# Patient Record
Sex: Female | Born: 1980 | Race: Black or African American | Hispanic: No | Marital: Single | Smoking: Never smoker
Health system: Southern US, Community
[De-identification: ages and names within clinical notes are randomized; demographics above are authoritative.]

## PROBLEM LIST (undated history)

## (undated) DIAGNOSIS — Z789 Other specified health status: Secondary | ICD-10-CM

---

## 2007-01-01 ENCOUNTER — Emergency Department: Payer: Self-pay | Admitting: Emergency Medicine

## 2007-01-07 ENCOUNTER — Emergency Department: Payer: Self-pay | Admitting: Unknown Physician Specialty

## 2011-05-24 ENCOUNTER — Emergency Department (HOSPITAL_COMMUNITY)
Admission: EM | Admit: 2011-05-24 | Discharge: 2011-05-24 | Disposition: A | Discharge status: Home / Self Care | Payer: Self-pay | Attending: Emergency Medicine | Admitting: Emergency Medicine

## 2011-05-24 DIAGNOSIS — N390 Urinary tract infection, site not specified: Secondary | ICD-10-CM | POA: Insufficient documentation

## 2011-05-24 DIAGNOSIS — N946 Dysmenorrhea, unspecified: Secondary | ICD-10-CM | POA: Insufficient documentation

## 2011-05-24 DIAGNOSIS — R109 Unspecified abdominal pain: Secondary | ICD-10-CM | POA: Insufficient documentation

## 2011-05-24 LAB — POCT PREGNANCY, URINE: Preg Test, Ur: NEGATIVE

## 2011-05-24 LAB — URINE MICROSCOPIC-ADD ON

## 2011-05-24 LAB — URINALYSIS, ROUTINE W REFLEX MICROSCOPIC
Hgb urine dipstick: NEGATIVE
Nitrite: NEGATIVE
Specific Gravity, Urine: 1.026 (ref 1.005–1.030)
Urobilinogen, UA: 2 mg/dL — ABNORMAL HIGH (ref 0.0–1.0)

## 2019-10-16 ENCOUNTER — Encounter: Payer: Self-pay | Admitting: Emergency Medicine

## 2019-10-16 ENCOUNTER — Other Ambulatory Visit: Payer: Self-pay

## 2019-10-16 ENCOUNTER — Emergency Department: Payer: BC Managed Care – PPO

## 2019-10-16 ENCOUNTER — Inpatient Hospital Stay: Payer: BC Managed Care – PPO

## 2019-10-16 ENCOUNTER — Inpatient Hospital Stay
Admission: EM | Admit: 2019-10-16 | Discharge: 2019-10-19 | DRG: 390 | Disposition: A | Discharge status: Home / Self Care | Payer: BC Managed Care – PPO | Attending: Surgery | Admitting: Surgery

## 2019-10-16 DIAGNOSIS — Z8249 Family history of ischemic heart disease and other diseases of the circulatory system: Secondary | ICD-10-CM | POA: Diagnosis not present

## 2019-10-16 DIAGNOSIS — K56609 Unspecified intestinal obstruction, unspecified as to partial versus complete obstruction: Secondary | ICD-10-CM | POA: Diagnosis not present

## 2019-10-16 DIAGNOSIS — N83202 Unspecified ovarian cyst, left side: Secondary | ICD-10-CM | POA: Diagnosis present

## 2019-10-16 DIAGNOSIS — R9389 Abnormal findings on diagnostic imaging of other specified body structures: Secondary | ICD-10-CM

## 2019-10-16 DIAGNOSIS — Z833 Family history of diabetes mellitus: Secondary | ICD-10-CM | POA: Diagnosis not present

## 2019-10-16 DIAGNOSIS — Z20822 Contact with and (suspected) exposure to covid-19: Secondary | ICD-10-CM | POA: Diagnosis present

## 2019-10-16 DIAGNOSIS — Z0189 Encounter for other specified special examinations: Secondary | ICD-10-CM

## 2019-10-16 DIAGNOSIS — N7091 Salpingitis, unspecified: Secondary | ICD-10-CM | POA: Diagnosis present

## 2019-10-16 DIAGNOSIS — R102 Pelvic and perineal pain: Secondary | ICD-10-CM

## 2019-10-16 DIAGNOSIS — N739 Female pelvic inflammatory disease, unspecified: Secondary | ICD-10-CM | POA: Diagnosis present

## 2019-10-16 HISTORY — DX: Other specified health status: Z78.9

## 2019-10-16 LAB — URINALYSIS, COMPLETE (UACMP) WITH MICROSCOPIC
Bilirubin Urine: NEGATIVE
Glucose, UA: NEGATIVE mg/dL
Ketones, ur: 5 mg/dL — AB
Nitrite: NEGATIVE
Protein, ur: 100 mg/dL — AB
RBC / HPF: >50 RBC/hpf — ABNORMAL HIGH (ref 0–5)
Specific Gravity, Urine: 1.035 — ABNORMAL HIGH (ref 1.005–1.030)
WBC, UA: >50 WBC/hpf — ABNORMAL HIGH (ref 0–5)
pH: 5 (ref 5.0–8.0)

## 2019-10-16 LAB — COMPREHENSIVE METABOLIC PANEL
ALT: 18 U/L (ref 0–44)
AST: 24 U/L (ref 15–41)
Albumin: 4 g/dL (ref 3.5–5.0)
Alkaline Phosphatase: 77 U/L (ref 38–126)
Anion gap: 14 (ref 5–15)
BUN: 11 mg/dL (ref 6–20)
CO2: 22 mmol/L (ref 22–32)
Calcium: 9.5 mg/dL (ref 8.9–10.3)
Chloride: 96 mmol/L — ABNORMAL LOW (ref 98–111)
Creatinine, Ser: 1.17 mg/dL — ABNORMAL HIGH (ref 0.44–1.00)
GFR calc Af Amer: >60 mL/min (ref >60)
GFR calc non Af Amer: 59 mL/min — ABNORMAL LOW (ref >60)
Glucose, Bld: 174 mg/dL — ABNORMAL HIGH (ref 70–99)
Potassium: 4.3 mmol/L (ref 3.5–5.1)
Sodium: 132 mmol/L — ABNORMAL LOW (ref 135–145)
Total Bilirubin: 1.4 mg/dL — ABNORMAL HIGH (ref 0.3–1.2)
Total Protein: 9.3 g/dL — ABNORMAL HIGH (ref 6.5–8.1)

## 2019-10-16 LAB — CBC
HCT: 45.4 % (ref 36.0–46.0)
Hemoglobin: 15 g/dL (ref 12.0–15.0)
MCH: 28.7 pg (ref 26.0–34.0)
MCHC: 33 g/dL (ref 30.0–36.0)
MCV: 86.8 fL (ref 80.0–100.0)
Platelets: 340 10*3/uL (ref 150–400)
RBC: 5.23 MIL/uL — ABNORMAL HIGH (ref 3.87–5.11)
RDW: 13.7 % (ref 11.5–15.5)
WBC: 14.5 10*3/uL — ABNORMAL HIGH (ref 4.0–10.5)
nRBC: 0 % (ref 0.0–0.2)

## 2019-10-16 LAB — RESPIRATORY PANEL BY RT PCR (FLU A&B, COVID)
Influenza A by PCR: NEGATIVE
Influenza B by PCR: NEGATIVE
SARS Coronavirus 2 by RT PCR: NEGATIVE

## 2019-10-16 LAB — LIPASE, BLOOD: Lipase: 16 U/L (ref 11–51)

## 2019-10-16 MED ORDER — MORPHINE SULFATE (PF) 4 MG/ML IV SOLN: 4.0000 mg | INTRAVENOUS | Status: DC | PRN

## 2019-10-16 MED ORDER — PANTOPRAZOLE SODIUM 40 MG IV SOLR
40.0000 mg | Freq: Every day | INTRAVENOUS | Status: DC
  Administered 2019-10-17 – 2019-10-18 (×3): 40 mg via INTRAVENOUS
  Filled 2019-10-16 (×3): qty 40

## 2019-10-16 MED ORDER — ONDANSETRON 4 MG PO TBDP: 4.0000 mg | ORAL_TABLET | Freq: Four times a day (QID) | ORAL | Status: DC | PRN

## 2019-10-16 MED ORDER — ONDANSETRON HCL 4 MG/2ML IJ SOLN: 4.0000 mg | Freq: Four times a day (QID) | INTRAMUSCULAR | Status: DC | PRN

## 2019-10-16 MED ORDER — ONDANSETRON HCL 4 MG/2ML IJ SOLN
4.0000 mg | Freq: Once | INTRAMUSCULAR | Status: AC
  Administered 2019-10-16: 4 mg via INTRAVENOUS
  Filled 2019-10-16: qty 2

## 2019-10-16 MED ORDER — DIATRIZOATE MEGLUMINE & SODIUM 66-10 % PO SOLN
90.0000 mL | Freq: Once | ORAL | Status: AC
  Administered 2019-10-17: 02:00:00 90 mL via NASOGASTRIC

## 2019-10-16 MED ORDER — ENOXAPARIN SODIUM 40 MG/0.4ML ~~LOC~~ SOLN
40.0000 mg | Freq: Two times a day (BID) | SUBCUTANEOUS | Status: DC
  Administered 2019-10-17 – 2019-10-19 (×5): 40 mg via SUBCUTANEOUS
  Filled 2019-10-16 (×5): qty 0.4

## 2019-10-16 MED ORDER — LACTATED RINGERS IV SOLN: INTRAVENOUS | Status: DC

## 2019-10-16 MED ORDER — PROMETHAZINE HCL 25 MG/ML IJ SOLN: 6.2500 mg | Freq: Four times a day (QID) | INTRAMUSCULAR | Status: DC | PRN

## 2019-10-16 MED ORDER — TRAMADOL HCL 50 MG PO TABS: 50.0000 mg | ORAL_TABLET | Freq: Four times a day (QID) | ORAL | Status: DC | PRN

## 2019-10-16 MED ORDER — ALBUTEROL SULFATE (2.5 MG/3ML) 0.083% IN NEBU: 2.5000 mg | INHALATION_SOLUTION | RESPIRATORY_TRACT | Status: DC | PRN

## 2019-10-16 MED ORDER — MORPHINE SULFATE (PF) 2 MG/ML IV SOLN
2.0000 mg | INTRAVENOUS | Status: DC | PRN
  Administered 2019-10-17 – 2019-10-18 (×4): 2 mg via INTRAVENOUS
  Filled 2019-10-16 (×4): qty 1

## 2019-10-16 MED ORDER — SODIUM CHLORIDE 0.9 % IV SOLN
100.0000 mg | Freq: Two times a day (BID) | INTRAVENOUS | Status: DC
  Administered 2019-10-17 – 2019-10-19 (×6): 100 mg via INTRAVENOUS
  Filled 2019-10-16 (×8): qty 100

## 2019-10-16 MED ORDER — IOHEXOL 300 MG/ML  SOLN
125.0000 mL | Freq: Once | INTRAMUSCULAR | Status: AC | PRN
  Administered 2019-10-16: 20:00:00 125 mL via INTRAVENOUS
  Filled 2019-10-16: qty 125

## 2019-10-16 MED ORDER — MORPHINE SULFATE (PF) 4 MG/ML IV SOLN
4.0000 mg | Freq: Once | INTRAVENOUS | Status: AC
  Administered 2019-10-16: 20:00:00 4 mg via INTRAVENOUS
  Filled 2019-10-16: qty 1

## 2019-10-16 MED ORDER — ACETAMINOPHEN 325 MG PO TABS: 650.0000 mg | ORAL_TABLET | Freq: Four times a day (QID) | ORAL | Status: DC | PRN

## 2019-10-16 NOTE — ED Notes (Signed)
Urine Pregnancy negative

## 2019-10-16 NOTE — ED Triage Notes (Signed)
Pt presents to ED via POV with c/o abdominal pain x 2 days. Reports diarrhea, reports feels like she needs to vomit but can't. Pt repeatedly screaming out during triage.

## 2019-10-16 NOTE — H&P (Addendum)
Subjective:   CC: SBO  HPI:  Jamie Johnston is a 39 y.o. female who was consulted by Corky Downs for issue above.  Symptoms were first noted a few days ago. Pain is sudden onset, sharp, no instigating factors, all four abdominal quadrants.  Associated with N/V, exacerbated by diarrhea, non-bloody     Past Medical History: none reported  Past Surgical History: none reported  Family History: negative for IBD  Social History:  reports that she has never smoked. She has never used smokeless tobacco. She reports previous alcohol use. She reports previous drug use.  Current Medications: augmentin prescribed by telemedicine doc for current symptoms  Allergies:  Allergies as of 10/16/2019  . (No Known Allergies)    ROS:  General: Denies weight loss, weight gain, fatigue, fevers, chills, and night sweats. Eyes: Denies blurry vision, double vision, eye pain, itchy eyes, and tearing. Ears: Denies hearing loss, earache, and ringing in ears. Nose: Denies sinus pain, congestion, infections, runny nose, and nosebleeds. Mouth/throat: Denies hoarseness, sore throat, bleeding gums, and difficulty swallowing. Heart: Denies chest pain, palpitations, racing heart, irregular heartbeat, leg pain or swelling, and decreased activity tolerance. Respiratory: Denies breathing difficulty, shortness of breath, wheezing, cough, and sputum. GI: Denies change in appetite, heartburn, constipation, and blood in stool. GU: Denies difficulty urinating, pain with urinating, urgency, frequency, blood in urine. Musculoskeletal: Denies joint stiffness, pain, swelling, muscle weakness. Skin: Denies rash, itching, mass, tumors, sores, and boils Neurologic: Denies headache, fainting, dizziness, seizures, numbness, and tingling. Psychiatric: Denies depression, anxiety, difficulty sleeping, and memory loss. Endocrine: Denies heat or cold intolerance, and increased thirst or urination. Blood/lymph: Denies easy bruising, easy  bruising, and swollen glands     Objective:     BP (!) 144/96   Pulse 73   Temp 98.2 F (36.8 C) (Oral)   Resp (!) 24   Ht 5\' 6"  (1.676 m)   Wt (!) 139.7 kg   SpO2 98%   BMI 49.71 kg/m   Constitutional :  alert, cooperative, appears stated age and no distress  Lymphatics/Throat:  no asymmetry, masses, or scars  Respiratory:  clear to auscultation bilaterally  Cardiovascular:  regular rate and rhythm  Gastrointestinal: soft, no guarding, some TTP in all four quadrants.   Musculoskeletal: Steady movement  Skin: Cool and moist, no surgical scars   Psychiatric: Normal affect, non-agitated, not confused       LABS:  CMP Latest Ref Rng & Units 10/16/2019  Glucose 70 - 99 mg/dL 174(H)  BUN 6 - 20 mg/dL 11  Creatinine 0.44 - 1.00 mg/dL 1.17(H)  Sodium 135 - 145 mmol/L 132(L)  Potassium 3.5 - 5.1 mmol/L 4.3  Chloride 98 - 111 mmol/L 96(L)  CO2 22 - 32 mmol/L 22  Calcium 8.9 - 10.3 mg/dL 9.5  Total Protein 6.5 - 8.1 g/dL 9.3(H)  Total Bilirubin 0.3 - 1.2 mg/dL 1.4(H)  Alkaline Phos 38 - 126 U/L 77  AST 15 - 41 U/L 24  ALT 0 - 44 U/L 18   CBC Latest Ref Rng & Units 10/16/2019  WBC 4.0 - 10.5 K/uL 14.5(H)  Hemoglobin 12.0 - 15.0 g/dL 15.0  Hematocrit 36.0 - 46.0 % 45.4  Platelets 150 - 400 K/uL 340    RADS: CLINICAL DATA:  Acute abdominal pain.  EXAM: CT ABDOMEN AND PELVIS WITH CONTRAST  TECHNIQUE: Multidetector CT imaging of the abdomen and pelvis was performed using the standard protocol following bolus administration of intravenous contrast.  CONTRAST:  131mL OMNIPAQUE IOHEXOL 300 MG/ML  SOLN  COMPARISON:  None.  FINDINGS: Lower chest: Minimal atelectasis at the lung bases posteriorly. Otherwise normal.  Hepatobiliary: No focal liver abnormality is seen. No gallstones, gallbladder wall thickening, or biliary dilatation.  Pancreas: Unremarkable. No pancreatic ductal dilatation or surrounding inflammatory changes.  Spleen: Normal in size without  focal abnormality.  Adrenals/Urinary Tract: The adrenal glands and kidneys are normal. No hydronephrosis. The bladder appears normal. There are inflammatory changes in the peritoneal fat around the dome of the bladder.  Stomach/Bowel: The colon appears normal including the appendix. The stomach and most of the small bowel are distended and there is an abrupt transition point in the distal small bowel best seen on image 63 of series 2 and image 39 of series 5. There is no mass at that site. The small bowel distal to this site is not distended. There is a distended tubular structure measuring approximately 18 mm in diameter in the right side of the pelvis. This could represent a dilated Meckel's diverticulum or possibly a dilated fallopian tube. There is inflammation in the peritoneal fat around this tubular structure extending around the nondistended distal small bowel. The terminal ileum appears decompressed and normal. There is a tiny amount of free fluid in the right lower quadrant as well as a small amount of fluid in the pelvic cul-de-sac.  Vascular/Lymphatic: No significant vascular findings are present. No enlarged abdominal or pelvic lymph nodes.  Reproductive: The uterus appears normal. 3 cm cyst or prominent follicle on the left ovary. The right ovary is not well seen. There is a tubular structure in the region of the right adnexa which could represent a dilated fallopian tube or a Meckel's diverticulum with adjacent inflammation. No free air or discrete abscess at this time.  Other: No abdominal wall hernia.  Musculoskeletal: No acute or significant osseous findings.  IMPRESSION: 1. Small bowel obstruction with a transition point in the distal small bowel. 2. Dilated tubular structure in the right side of the pelvis which could represent a dilated fallopian tube or possibly an inflamed Meckel's diverticulum with adjacent inflammation. 3. Small amount of free  fluid in the right lower quadrant and in the pelvic cul-de-sac.  Electronically Signed: By: Francene Boyers M.D. On: 10/16/2019 20:53 Assessment:   SBO  Plan:    Unknown origin, with CT results noted above.  Will start with pelvic US to see if able to identify fallopian tube vs possible meckel's.  Even if meckel's diverticulitis, unsure why it will cause sudden stricturing as seen on CT.  Will start with NG tube decompression, SBFT to see if obstruction resolves.  IVF.  Will hold off on ABX for now, until US done to further assess.  ADDENDUM:  [place] concerning for bilateral pyosalpinx.  Dr. Valentino Saxon, OBGYN consulted and recommended to start doxy IV for now.   Will still proceed with SBFT to see if she truly has obstruction or if this is potentially reactive ileus to her PID.

## 2019-10-16 NOTE — ED Notes (Signed)
ED TO INPATIENT HANDOFF REPORT  ED Nurse Name and Phone #: Anette Riedel [phone]  S Name/Age/Gender Jamie Johnston 39 y.o. female Room/Bed: ED05A/ED05A  Code Status   Code Status: Not on file  Home/SNF/Other Home Patient oriented to: self, place, time and situation Is this baseline? Yes   Triage Complete: Triage complete  Chief Complaint SBO (small bowel obstruction) (HCC) [K56.609]  Triage Note Pt presents to ED via POV with c/o abdominal pain x 2 days. Reports diarrhea, reports feels like she needs to vomit but can't. Pt repeatedly screaming out during triage.     Allergies No Known Allergies  Level of Care/Admitting Diagnosis ED Disposition    ED Disposition Condition Comment   Admit  Hospital Area: [facility] [[id number]]  Level of Care: Med-Surg [16]  Covid Evaluation: Asymptomatic Screening Protocol (No Symptoms)  Diagnosis: SBO (small bowel obstruction) [facility]) [[id number]]  Admitting Physician: Sung Amabile [[id number]]  Attending Physician: Sung Amabile [id number]  Estimated length of stay: 3 - 4 days  Certification:: I certify this patient will need inpatient services for at least 2 midnights       B Medical/Surgery History History reviewed. No pertinent past medical history. History reviewed. No pertinent surgical history.   A IV Location/Drains/Wounds Patient Lines/Drains/Airways Status   Active Line/Drains/Airways    Name:   Placement date:   Placement time:   Site:   Days:   Peripheral IV 10/16/19 Right;Anterior Forearm   10/16/19    1934    Forearm   less than 1   NG/OG Tube Nasogastric 16 Fr. Left nare Aucultation Documented cm marking at nare/ corner of mouth 54 cm   10/16/19    2138    Left nare   less than 1          Intake/Output Last 24 hours No intake or output data in the 24 hours ending 10/16/19 2253  Labs/Imaging Results for orders placed or performed during the hospital encounter of 10/16/19 (from the past 48 hour(s))  CBC      Status: Abnormal   Collection Time: 10/16/19  7:18 PM  Result Value Ref Range   WBC 14.5 (H) 4.0 - 10.5 K/uL   RBC 5.23 (H) 3.87 - 5.11 MIL/uL   Hemoglobin 15.0 12.0 - 15.0 g/dL   HCT 85.6 31.4 - 97.0 %   MCV 86.8 80.0 - 100.0 fL   MCH 28.7 26.0 - 34.0 pg   MCHC 33.0 30.0 - 36.0 g/dL   RDW 26.3 78.5 - 88.5 %   Platelets 340 150 - 400 K/uL   nRBC 0.0 0.0 - 0.2 %    Comment: Performed at [facility], [address] [place], [place] [address]  Comprehensive metabolic panel     Status: Abnormal   Collection Time: 10/16/19  7:18 PM  Result Value Ref Range   Sodium 132 (L) 135 - 145 mmol/L   Potassium 4.3 3.5 - 5.1 mmol/L    Comment: HEMOLYSIS AT THIS LEVEL MAY AFFECT RESULT   Chloride 96 (L) 98 - 111 mmol/L   CO2 22 22 - 32 mmol/L   Glucose, Bld 174 (H) 70 - 99 mg/dL   BUN 11 6 - 20 mg/dL   Creatinine, Ser 1.28 (H) 0.44 - 1.00 mg/dL   Calcium 9.5 8.9 - 78.6 mg/dL   Total Protein 9.3 (H) 6.5 - 8.1 g/dL   Albumin 4.0 3.5 - 5.0 g/dL   AST 24 15 - 41 U/L    Comment: HEMOLYSIS  AT THIS LEVEL MAY AFFECT RESULT   ALT 18 0 - 44 U/L   Alkaline Phosphatase 77 38 - 126 U/L   Total Bilirubin 1.4 (H) 0.3 - 1.2 mg/dL    Comment: HEMOLYSIS AT THIS LEVEL MAY AFFECT RESULT   GFR calc non Af Amer 59 (L) >60 mL/min   GFR calc Af Amer >60 >60 mL/min   Anion gap 14 5 - 15    Comment: Performed at [facility], [address] [place], [place] [address]  Lipase, blood     Status: None   Collection Time: 10/16/19  7:18 PM  Result Value Ref Range   Lipase 16 11 - 51 U/L    Comment: Performed at [facility], [address] [place], [place] [address]  Urinalysis, Complete w Microscopic     Status: Abnormal   Collection Time: 10/16/19  7:18 PM  Result Value Ref Range   Color, Urine AMBER (A) YELLOW    Comment: BIOCHEMICALS MAY BE AFFECTED BY COLOR   APPearance CLOUDY (A) CLEAR   Specific Gravity, Urine 1.035 (H) 1.005 - 1.030   pH 5.0 5.0 - 8.0   Glucose, UA  NEGATIVE NEGATIVE mg/dL   Hgb urine dipstick SMALL (A) NEGATIVE   Bilirubin Urine NEGATIVE NEGATIVE   Ketones, ur 5 (A) NEGATIVE mg/dL   Protein, ur 295 (A) NEGATIVE mg/dL   Nitrite NEGATIVE NEGATIVE   Leukocytes,Ua TRACE (A) NEGATIVE   RBC / HPF >50 (H) 0 - 5 RBC/hpf   WBC, UA >50 (H) 0 - 5 WBC/hpf   Bacteria, UA FEW (A) NONE SEEN   Squamous Epithelial / LPF 6-10 0 - 5   Mucus PRESENT    Hyaline Casts, UA PRESENT    Non Squamous Epithelial PRESENT (A) NONE SEEN    Comment: Performed at [facility], [address] [place], [place] [address]   CT ABDOMEN PELVIS W CONTRAST  Addendum Date: 10/16/2019   ADDENDUM REPORT: 10/16/2019 21:02 ADDENDUM: Critical Value/emergent results were called by telephone at the time of interpretation on 10/16/2019 at 9:01 pm to the emergency room physician, who verbally acknowledged these results. Electronically Signed   By: Francene Boyers M.D.   On: 10/16/2019 21:02   Result Date: 10/16/2019 CLINICAL DATA:  Acute abdominal pain. EXAM: CT ABDOMEN AND PELVIS WITH CONTRAST TECHNIQUE: Multidetector CT imaging of the abdomen and pelvis was performed using the standard protocol following bolus administration of intravenous contrast. CONTRAST:  OMNIPAQUE IOHEXOL 300 MG/ML  SOLN COMPARISON:  None. FINDINGS: Lower chest: Minimal atelectasis at the lung bases posteriorly. Otherwise normal. Hepatobiliary: No focal liver abnormality is seen. No gallstones, gallbladder wall thickening, or biliary dilatation. Pancreas: Unremarkable. No pancreatic ductal dilatation or surrounding inflammatory changes. Spleen: Normal in size without focal abnormality. Adrenals/Urinary Tract: The adrenal glands and kidneys are normal. No hydronephrosis. The bladder appears normal. There are inflammatory changes in the peritoneal fat around the dome of the bladder. Stomach/Bowel: The colon appears normal including the appendix. The stomach and most of the small bowel are distended and  there is an abrupt transition point in the distal small bowel best seen on image 63 of series 2 and image 39 of series 5. There is no mass at that site. The small bowel distal to this site is not distended. There is a distended tubular structure measuring approximately 18 mm in diameter in the right side of the pelvis. This could represent a dilated Meckel's diverticulum or possibly a dilated fallopian tube.  There is inflammation in the peritoneal fat around this tubular structure extending around the nondistended distal small bowel. The terminal ileum appears decompressed and normal. There is a tiny amount of free fluid in the right lower quadrant as well as a small amount of fluid in the pelvic cul-de-sac. Vascular/Lymphatic: No significant vascular findings are present. No enlarged abdominal or pelvic lymph nodes. Reproductive: The uterus appears normal. 3 cm cyst or prominent follicle on the left ovary. The right ovary is not well seen. There is a tubular structure in the region of the right adnexa which could represent a dilated fallopian tube or a Meckel's diverticulum with adjacent inflammation. No free air or discrete abscess at this time. Other: No abdominal wall hernia. Musculoskeletal: No acute or significant osseous findings. IMPRESSION: 1. Small bowel obstruction with a transition point in the distal small bowel. 2. Dilated tubular structure in the right side of the pelvis which could represent a dilated fallopian tube or possibly an inflamed Meckel's diverticulum with adjacent inflammation. 3. Small amount of free fluid in the right lower quadrant and in the pelvic cul-de-sac. Electronically Signed: By: Lorriane Shire M.D. On: 10/16/2019 20:53   US PELVIC COMPLETE WITH TRANSVAGINAL  Result Date: 10/16/2019 CLINICAL DATA:  Possible dilated fallopian tube on CT EXAM: TRANSABDOMINAL AND TRANSVAGINAL ULTRASOUND OF PELVIS TECHNIQUE: Both transabdominal and transvaginal ultrasound examinations of the  pelvis were performed. Transabdominal technique was performed for global imaging of the pelvis including uterus, ovaries, adnexal regions, and pelvic cul-de-sac. It was necessary to proceed with endovaginal exam following the transabdominal exam to visualize the ovaries and adnexa. COMPARISON:  CT earlier today FINDINGS: Uterus Measurements: 6.9 x 4.4 x 5.8 cm = volume: 90 mL. Small 9 mm fibroid in the inferior left uterus. Endometrium Thickness: 7 mm in thickness.  No focal abnormality visualized. Right ovary Measurements: 2.9 x 2.6 x 2.7 cm = volume: 11 mL. Dilated tubular structure in the right adnexa with internal echoes. Left ovary Measurements: 3.8 x 2.4 x 2.7 cm = volume: 11.9 mL. 2.6 cm hypoechoic complex cystic structure, likely hemorrhagic cyst. Irregular fluid-filled tubular structure noted in the left adnexa with internal echoes. Other findings Small amount of complex free fluid in the pelvis. IMPRESSION: Irregular fluid-filled tubular structures in the adnexa bilaterally with internal echoes. Question bilateral pyosalpinx. 2.6 cm complex cyst in the left ovary, likely hemorrhagic cyst. Complex free fluid in the pelvis. Electronically Signed   By: Rolm Baptise M.D.   On: 10/16/2019 22:42    Pending Labs [employer] (From admission, onward)    Start     Ordered   Signed and Held  HIV Antibody (routine testing w rflx)  (HIV Antibody (Routine testing w reflex) panel)  Once,   R     Signed and Held   Signed and Held  Basic metabolic panel  Daily,   R     Signed and Held   Signed and Held  Magnesium  Daily,   R     Signed and Held   Signed and Held  Phosphorus  Daily,   R     Signed and Held   Signed and Held  CBC  Daily,   R     Signed and Held          Vitals/Pain Today's Vitals   10/16/19 1905 10/16/19 1930 10/16/19 2136 10/16/19 2145  BP:  (!) 144/96 (!) 145/90   Pulse: 76 73 82 81  Resp:  Temp:      TempSrc:      SpO2: 98% 98% 95% 96%  Weight:      Height:       PainSc:        Isolation Precautions No active isolations  Medications Medications  morphine 4 MG/ML injection 4 mg (has no administration in time range)  morphine 4 MG/ML injection 4 mg (4 mg Intravenous Given 10/16/19 1937)  ondansetron (ZOFRAN) injection 4 mg (4 mg Intravenous Given 10/16/19 1937)  iohexol (OMNIPAQUE) 300 MG/ML solution 125 mL (125 mLs Intravenous Contrast Given 10/16/19 2021)    Mobility walks Low fall risk   Focused Assessments    R Recommendations: See Admitting Provider Note  Report given to:   Additional Notes:

## 2019-10-16 NOTE — ED Provider Notes (Signed)
[facility] Emergency Department Provider Note   ____________________________________________    I have reviewed the triage vital signs and the nursing notes.   HISTORY  Chief Complaint Abdominal Pain     HPI KEYIRA MONDESIR is a 39 y.o. female who presents with complaints of diffuse abdominal pain.  Patient reports severe abdominal pain which has been ongoing for nearly 2 days.  She is unable to pinpoint where the pain is worst but reports it is diffuse.  It does not radiate.  She does have nausea but no vomiting.  Normal stools.  No dysuria, no hematuria.  She has never had this before.  No history of abdominal surgeries.  She does not take anything for this.  History reviewed. No pertinent past medical history.  There are no problems to display for this patient.   History reviewed. No pertinent surgical history.  Prior to Admission medications   Not on File     Allergies Patient has no known allergies.  No family history on file.  Social History Social History   Tobacco Use  . Smoking status: Never Smoker  . Smokeless tobacco: Never Used  Substance Use Topics  . Alcohol use: Not Currently  . Drug use: Not Currently    Review of Systems  Constitutional: No fever/chills Eyes: No visual changes.  ENT: No sore throat. Cardiovascular: Denies chest pain. Respiratory: Denies shortness of breath. Gastrointestinal: As above Genitourinary: Negative for dysuria.  No hematuria  musculoskeletal: Negative for back pain. Skin: Negative for rash. Neurological: Negative for headaches   ____________________________________________   PHYSICAL EXAM:  VITAL SIGNS: ED Triage Vitals [10/16/19 1854]  Enc Vitals Group     BP (!) 138/101     Pulse Rate 87     Resp (!) 24     Temp 98.2 F (36.8 C)     Temp Source Oral     SpO2 98 %     Weight (!) 139.7 kg (308 lb)     Height 1.676 m (5\' 6" )     Head Circumference      Peak Flow       Pain Score 10     Pain Loc      Pain Edu?      Excl. in GC?     Constitutional: Alert and oriented.  Anxious  Nose: No congestion/rhinnorhea. Mouth/Throat: Mucous membranes are moist.    Cardiovascular: Normal rate, regular rhythm.  Good peripheral circulation. Respiratory: Normal respiratory effort.  No retractions.. Gastrointestinal: Soft, diffuse tenderness to palpation, no CVA tenderness, no significant distention  Musculoskeletal: .  Warm and well perfused Neurologic:  Normal speech and language. No gross focal neurologic deficits are appreciated.  Skin:  Skin is warm, dry and intact. No rash noted. Psychiatric: Mood and affect are normal. Speech and behavior are normal.  ____________________________________________   LABS (all labs ordered are listed, but only abnormal results are displayed)  Labs Reviewed  CBC - Abnormal; Notable for the following components:      Result Value   WBC 14.5 (*)    RBC 5.23 (*)    All other components within normal limits  COMPREHENSIVE METABOLIC PANEL - Abnormal; Notable for the following components:   Sodium 132 (*)    Chloride 96 (*)    Glucose, Bld 174 (*)    Creatinine, Ser 1.17 (*)    Total Protein 9.3 (*)    Total Bilirubin 1.4 (*)    GFR calc non Af  Amer 59 (*)    All other components within normal limits  URINALYSIS, COMPLETE (UACMP) WITH MICROSCOPIC - Abnormal; Notable for the following components:   Color, Urine AMBER (*)    APPearance CLOUDY (*)    Specific Gravity, Urine 1.035 (*)    Hgb urine dipstick SMALL (*)    Ketones, ur 5 (*)    Protein, ur 100 (*)    Leukocytes,Ua TRACE (*)    RBC / HPF >50 (*)    WBC, UA >50 (*)    Bacteria, UA FEW (*)    Non Squamous Epithelial PRESENT (*)    All other components within normal limits  LIPASE, BLOOD   ____________________________________________  EKG   ____________________________________________  RADIOLOGY  CT abdomen  pelvis ____________________________________________   PROCEDURES  Procedure(s) performed: No  Procedures   Critical Care performed: yes  CRITICAL CARE Performed by: Lavonia Drafts   Total critical care time: 30 minutes  Critical care time was exclusive of separately billable procedures and treating other patients.  Critical care was necessary to treat or prevent imminent or life-threatening deterioration.  Critical care was time spent personally by me on the following activities: development of treatment plan with patient and/or surrogate as well as nursing, discussions with consultants, evaluation of patient's response to treatment, examination of patient, obtaining history from patient or surrogate, ordering and performing treatments and interventions, ordering and review of laboratory studies, ordering and review of radiographic studies, pulse oximetry and re-evaluation of patient's condition.  ____________________________________________   INITIAL IMPRESSION / ASSESSMENT AND PLAN / ED COURSE  Pertinent labs & imaging results that were available during my care of the patient were reviewed by me and considered in my medical decision making (see chart for details).  Patient presents with diffuse abdominal pain, she complains of severe pain.  We will treat with IV morphine, IV Zofran, obtain CT abdomen pelvis, and obtain labs.  Differential is wide and includes colitis, gastroenteritis, perforation, infection or abscess.  Lab work is significant for elevated white blood cell count of 14.5, mild hyponatremia  Contacted by radiologist and notified of CT scan demonstrates small bowel obstruction with possible Meckel's diverticulum versus dilated fallopian tube.  I have discussed with Dr. Lysle Pearl of surgery, will insert NG tube, obtain ultrasound of the pelvis to evaluate further.  Dr. Lysle Pearl will admit the patient    ____________________________________________   FINAL CLINICAL  IMPRESSION(S) / ED DIAGNOSES  Final diagnoses:  Small bowel obstruction [facility])        Note:  This document was prepared using Dragon voice recognition software and may include unintentional dictation errors.   Lavonia Drafts, MD 10/16/19 2108

## 2019-10-16 NOTE — ED Notes (Signed)
Patient states has been having abd pain for 2-3 days. Patient states, "it hurts every where on her stomach." Per pt has had runny bowel movements x 2 earlier today but last BM was normal.

## 2019-10-16 NOTE — ED Notes (Signed)
Pt transferred to CT.

## 2019-10-17 ENCOUNTER — Inpatient Hospital Stay: Payer: BC Managed Care – PPO

## 2019-10-17 ENCOUNTER — Encounter: Payer: Self-pay | Admitting: Surgery

## 2019-10-17 DIAGNOSIS — N83202 Unspecified ovarian cyst, left side: Secondary | ICD-10-CM

## 2019-10-17 DIAGNOSIS — R1084 Generalized abdominal pain: Secondary | ICD-10-CM

## 2019-10-17 DIAGNOSIS — N7003 Acute salpingitis and oophoritis: Secondary | ICD-10-CM

## 2019-10-17 LAB — BASIC METABOLIC PANEL
Anion gap: 11 (ref 5–15)
BUN: 13 mg/dL (ref 6–20)
CO2: 25 mmol/L (ref 22–32)
Calcium: 8.8 mg/dL — ABNORMAL LOW (ref 8.9–10.3)
Chloride: 100 mmol/L (ref 98–111)
Creatinine, Ser: 0.88 mg/dL (ref 0.44–1.00)
GFR calc Af Amer: >60 mL/min (ref >60)
GFR calc non Af Amer: >60 mL/min (ref >60)
Glucose, Bld: 116 mg/dL — ABNORMAL HIGH (ref 70–99)
Potassium: 3.9 mmol/L (ref 3.5–5.1)
Sodium: 136 mmol/L (ref 135–145)

## 2019-10-17 LAB — CBC
HCT: 37.8 % (ref 36.0–46.0)
Hemoglobin: 12.5 g/dL (ref 12.0–15.0)
MCH: 28.7 pg (ref 26.0–34.0)
MCHC: 33.1 g/dL (ref 30.0–36.0)
MCV: 86.9 fL (ref 80.0–100.0)
Platelets: 323 10*3/uL (ref 150–400)
RBC: 4.35 MIL/uL (ref 3.87–5.11)
RDW: 13.9 % (ref 11.5–15.5)
WBC: 13.1 10*3/uL — ABNORMAL HIGH (ref 4.0–10.5)
nRBC: 0 % (ref 0.0–0.2)

## 2019-10-17 LAB — CHLAMYDIA/NGC RT PCR (ARMC ONLY)
Chlamydia Tr: NOT DETECTED
N gonorrhoeae: NOT DETECTED

## 2019-10-17 LAB — MAGNESIUM: Magnesium: 2.4 mg/dL (ref 1.7–2.4)

## 2019-10-17 LAB — PHOSPHORUS: Phosphorus: 3.8 mg/dL (ref 2.5–4.6)

## 2019-10-17 LAB — POCT PREGNANCY, URINE: Preg Test, Ur: NEGATIVE

## 2019-10-17 LAB — HIV ANTIBODY (ROUTINE TESTING W REFLEX): HIV Screen 4th Generation wRfx: NONREACTIVE

## 2019-10-17 MED ORDER — KCL IN DEXTROSE-NACL 40-5-0.45 MEQ/L-%-% IV SOLN
INTRAVENOUS | Status: DC
  Filled 2019-10-17 (×5): qty 1000

## 2019-10-17 NOTE — Consult Note (Addendum)
Reason for Consult: Abdominal pain, ovarian cyst and pyelosalpinx Referring Physician: Sung Amabile, DO (General Surgery).  HPI:  Jamie Johnston is an 39 y.o. G0P0 female who presented to the Emergency Room with complaints of diffuse abdominal pain for ~ 2 days with associated nausea but no vomiting.  Patient reports that there are no aggravating or alleviating factors. Notes last meal was yesterday afternoon, a bowl of chili, but could not eat it all. She denies constipation, but did not several episodes of diarrhea the day prior after taking Tylenol, but resolved after she took Aleve for her pain. She denies urinary disturbances.  Reports that she was recently prescribed an antibiotic, Amoxicillin and Albuterol which she was taking for a sinus infection.    Pertinent Gynecological History: Menses: flow is moderate and regular every month without intermenstrual spotting Contraception: abstinence x 1 year.  Sexually transmitted diseases: no past history Previous GYN Procedures: none  Last pap: patient cannot recall her last pap smear.  Has not been seen by a GYN in "a very long time".      Past Medical History:  Diagnosis Date  . No known health problems     History reviewed. No pertinent surgical history.  Family History  Problem Relation Age of Onset  . Diabetes Mother   . Hypertension Mother   . Diabetes Maternal Grandmother   . Cancer Neg Hx     Social History:  reports that she has never smoked. She has never used smokeless tobacco. She reports previous alcohol use. She reports previous drug use.  Allergies: No Known Allergies  No current facility-administered medications on file prior to encounter.   Current Outpatient Medications on File Prior to Encounter  Medication Sig Dispense Refill  . albuterol (VENTOLIN HFA) 108 (90 Base) MCG/ACT inhaler Inhale 2 puffs into the lungs every 4 (four) hours as needed for wheezing or shortness of breath.    Marland Kitchen  amoxicillin-clavulanate (AUGMENTIN) 875-125 MG tablet Take 1 tablet by mouth 2 (two) times daily.    . Promethazine-DM 6.25-15 MG/5ML SOLN Take 5 mLs by mouth every 4 (four) hours as needed (cough).        Review of Systems  Constitutional: Negative for chills, fatigue and fever.  HENT: Positive for congestion, sinus pressure and sinus pain. Negative for sore throat and trouble swallowing.   Eyes: Negative.   Respiratory: Negative for cough, shortness of breath and wheezing.   Cardiovascular: Negative for chest pain, palpitations and leg swelling.  Gastrointestinal: Positive for abdominal pain and nausea. Negative for abdominal distention, blood in stool and vomiting.  Endocrine: Negative.   Genitourinary: Negative for difficulty urinating, dyspareunia, dysuria, flank pain, frequency, urgency, vaginal bleeding and vaginal pain.  Musculoskeletal: Negative.   Allergic/Immunologic: Negative.   Neurological: Negative.   Psychiatric/Behavioral: Negative.     Blood pressure 132/79, pulse 76, temperature 98.7 F (37.1 C), temperature source Oral, resp. rate 19, height 6\' 1"  (1.854 m), weight (!) 136.6 kg, SpO2 100 %. Physical Exam  Constitutional: She is oriented to person, place, and time. She appears well-developed and well-nourished. No distress.  HENT:  Head: Normocephalic and atraumatic.  NG tube draining bilious fluid  Eyes: Pupils are equal, round, and reactive to light. Conjunctivae and EOM are normal.  Neck: No thyromegaly present.  Cardiovascular: Normal rate, regular rhythm and normal heart sounds. Exam reveals no gallop and no friction rub.  No murmur heard. Respiratory: Effort normal and breath sounds normal. No respiratory distress. She has no wheezes.  GI: Soft. She exhibits no distension and no mass. There is abdominal tenderness. There is no rebound.  Genitourinary:    Genitourinary Comments: Pelvic exam deferred   Musculoskeletal:        General: Normal range of motion.      Cervical back: Normal range of motion and neck supple.  Neurological: She is alert and oriented to person, place, and time.  Skin: Skin is warm and dry.  Psychiatric: She has a normal mood and affect. Her behavior is normal.    Results for orders placed or performed during the hospital encounter of 10/16/19 (from the past 48 hour(s))  CBC     Status: Abnormal   Collection Time: 10/16/19  7:18 PM  Result Value Ref Range   WBC 14.5 (H) 4.0 - 10.5 K/uL   RBC 5.23 (H) 3.87 - 5.11 MIL/uL   Hemoglobin 15.0 12.0 - 15.0 g/dL   HCT 45.4 36.0 - 46.0 %   MCV 86.8 80.0 - 100.0 fL   MCH 28.7 26.0 - 34.0 pg   MCHC 33.0 30.0 - 36.0 g/dL   RDW 13.7 11.5 - 15.5 %   Platelets 340 150 - 400 K/uL   nRBC 0.0 0.0 - 0.2 %    Comment: Performed at [facility], [address] [place], [place] [address]  Comprehensive metabolic panel     Status: Abnormal   Collection Time: 10/16/19  7:18 PM  Result Value Ref Range   Sodium 132 (L) 135 - 145 mmol/L   Potassium 4.3 3.5 - 5.1 mmol/L    Comment: HEMOLYSIS AT THIS LEVEL MAY AFFECT RESULT   Chloride 96 (L) 98 - 111 mmol/L   CO2 22 22 - 32 mmol/L   Glucose, Bld 174 (H) 70 - 99 mg/dL   BUN 11 6 - 20 mg/dL   Creatinine, Ser 1.17 (H) 0.44 - 1.00 mg/dL   Calcium 9.5 8.9 - 10.3 mg/dL   Total Protein 9.3 (H) 6.5 - 8.1 g/dL   Albumin 4.0 3.5 - 5.0 g/dL   AST 24 15 - 41 U/L    Comment: HEMOLYSIS AT THIS LEVEL MAY AFFECT RESULT   ALT 18 0 - 44 U/L   Alkaline Phosphatase 77 38 - 126 U/L   Total Bilirubin 1.4 (H) 0.3 - 1.2 mg/dL    Comment: HEMOLYSIS AT THIS LEVEL MAY AFFECT RESULT   GFR calc non Af Amer 59 (L) >60 mL/min   GFR calc Af Amer >60 >60 mL/min   Anion gap 14 5 - 15    Comment: Performed at [facility], [address] [place], [place] [address]  Lipase, blood     Status: None   Collection Time: 10/16/19  7:18 PM  Result Value Ref Range   Lipase 16 11 - 51 U/L    Comment: Performed at [facility], [address] [place],  [address]  Urinalysis, Complete w Microscopic     Status: Abnormal   Collection Time: 10/16/19  7:18 PM  Result Value Ref Range   Color, Urine AMBER (A) YELLOW    Comment: BIOCHEMICALS MAY BE AFFECTED BY COLOR   APPearance CLOUDY (A) CLEAR   Specific Gravity, Urine 1.035 (H) 1.005 - 1.030   pH 5.0 5.0 - 8.0   Glucose, UA NEGATIVE NEGATIVE mg/dL   Hgb urine dipstick SMALL (A) NEGATIVE   Bilirubin Urine NEGATIVE NEGATIVE   Ketones, ur 5 (A) NEGATIVE mg/dL   Protein, ur 100 (A) NEGATIVE mg/dL  Nitrite NEGATIVE NEGATIVE   Leukocytes,Ua TRACE (A) NEGATIVE   RBC / HPF >50 (H) 0 - 5 RBC/hpf   WBC, UA >50 (H) 0 - 5 WBC/hpf   Bacteria, UA FEW (A) NONE SEEN   Squamous Epithelial / LPF 6-10 0 - 5   Mucus PRESENT    Hyaline Casts, UA PRESENT    Non Squamous Epithelial PRESENT (A) NONE SEEN    Comment: Performed at [facility], [address] [place], [place] [address]  Respiratory Panel by RT PCR (Flu A&B, Covid) - Nasopharyngeal Swab     Status: None   Collection Time: 10/16/19 10:44 PM   Specimen: Nasopharyngeal Swab  Result Value Ref Range   SARS Coronavirus 2 by RT PCR NEGATIVE NEGATIVE    Comment: (NOTE) SARS-CoV-2 target nucleic acids are NOT DETECTED. The SARS-CoV-2 RNA is generally detectable in upper respiratoy specimens during the acute phase of infection. The lowest concentration of SARS-CoV-2 viral copies this assay can detect is 131 copies/mL. A negative result does not preclude SARS-Cov-2 infection and should not be used as the sole basis for treatment or other patient management decisions. A negative result may occur with  improper specimen collection/handling, submission of specimen other than nasopharyngeal swab, presence of viral mutation(s) within the areas targeted by this assay, and inadequate number of viral copies (<131 copies/mL). A negative result must be combined with clinical observations, patient history, and epidemiological  information. The expected result is Negative. Fact Sheet for Patients:  https://www.moore.com/https://www.fda.gov/media/142436/download Fact Sheet for Healthcare Providers:  https://www.young.biz/https://www.fda.gov/media/142435/download This test is not yet ap proved or cleared by the [place] FDA and  has been authorized for detection and/or diagnosis of SARS-CoV-2 by FDA under an Emergency Use Authorization (EUA). This EUA will remain  in effect (meaning this test can be used) for the duration of the COVID-19 declaration under Section 564(b)(1) of the Act, 21 U.S.C. section 360bbb-3(b)(1), unless the authorization is terminated or revoked sooner.    Influenza A by PCR NEGATIVE NEGATIVE   Influenza B by PCR NEGATIVE NEGATIVE    Comment: (NOTE) The Xpert Xpress SARS-CoV-2/FLU/RSV assay is intended as an aid in  the diagnosis of influenza from Nasopharyngeal swab specimens and  should not be used as a sole basis for treatment. Nasal washings and  aspirates are unacceptable for Xpert Xpress SARS-CoV-2/FLU/RSV  testing. Fact Sheet for Patients: https://www.moore.com/https://www.fda.gov/media/142436/download Fact Sheet for Healthcare Providers: https://www.young.biz/https://www.fda.gov/media/142435/download This test is not yet approved or cleared by the [place] FDA and  has been authorized for detection and/or diagnosis of SARS-CoV-2 by  FDA under an Emergency Use Authorization (EUA). This EUA will remain  in effect (meaning this test can be used) for the duration of the  Covid-19 declaration under Section 564(b)(1) of the Act, 21  U.S.C. section 360bbb-3(b)(1), unless the authorization is  terminated or revoked. Performed at [facility], [address] [place], [place] [address]   Basic metabolic panel     Status: Abnormal   Collection Time: 10/17/19  6:35 AM  Result Value Ref Range   Sodium 136 135 - 145 mmol/L   Potassium 3.9 3.5 - 5.1 mmol/L   Chloride 100 98 - 111 mmol/L   CO2 25 22 - 32 mmol/L   Glucose, Bld 116 (H) 70 - 99 mg/dL    BUN 13 6 - 20 mg/dL   Creatinine, Ser 1.470.88 0.44 - 1.00 mg/dL   Calcium 8.8 (L) 8.9 - 10.3 mg/dL   GFR calc non Af Amer >60 >  60 mL/min   GFR calc Af Amer >60 >60 mL/min   Anion gap 11 5 - 15    Comment: Performed at [facility], [address] [place], [place] [address]  Magnesium     Status: None   Collection Time: 10/17/19  6:35 AM  Result Value Ref Range   Magnesium 2.4 1.7 - 2.4 mg/dL    Comment: Performed at [facility], [address] [place], [place] [address]  Phosphorus     Status: None   Collection Time: 10/17/19  6:35 AM  Result Value Ref Range   Phosphorus 3.8 2.5 - 4.6 mg/dL    Comment: Performed at [facility], [address] [place], [place] [address]  CBC     Status: Abnormal   Collection Time: 10/17/19  6:35 AM  Result Value Ref Range   WBC 13.1 (H) 4.0 - 10.5 K/uL   RBC 4.35 3.87 - 5.11 MIL/uL   Hemoglobin 12.5 12.0 - 15.0 g/dL   HCT 65.7 84.6 - 96.2 %   MCV 86.9 80.0 - 100.0 fL   MCH 28.7 26.0 - 34.0 pg   MCHC 33.1 30.0 - 36.0 g/dL   RDW 95.2 84.1 - 32.4 %   Platelets 323 150 - 400 K/uL   nRBC 0.0 0.0 - 0.2 %    Comment: Performed at [facility], [address] [place], [place] [address]    CT ABDOMEN PELVIS W CONTRAST  Addendum Date: 10/16/2019   ADDENDUM REPORT: 10/16/2019 21:02 ADDENDUM: Critical Value/emergent results were called by telephone at the time of interpretation on 10/16/2019 at 9:01 pm to the emergency room physician, who verbally acknowledged these results. Electronically Signed   By: Francene Boyers M.D.   On: 10/16/2019 21:02   Result Date: 10/16/2019 CLINICAL DATA:  Acute abdominal pain. EXAM: CT ABDOMEN AND PELVIS WITH CONTRAST TECHNIQUE: Multidetector CT imaging of the abdomen and pelvis was performed using the standard protocol following bolus administration of intravenous contrast. CONTRAST:  OMNIPAQUE IOHEXOL 300 MG/ML  SOLN COMPARISON:  None. FINDINGS: Lower chest: Minimal atelectasis at  the lung bases posteriorly. Otherwise normal. Hepatobiliary: No focal liver abnormality is seen. No gallstones, gallbladder wall thickening, or biliary dilatation. Pancreas: Unremarkable. No pancreatic ductal dilatation or surrounding inflammatory changes. Spleen: Normal in size without focal abnormality. Adrenals/Urinary Tract: The adrenal glands and kidneys are normal. No hydronephrosis. The bladder appears normal. There are inflammatory changes in the peritoneal fat around the dome of the bladder. Stomach/Bowel: The colon appears normal including the appendix. The stomach and most of the small bowel are distended and there is an abrupt transition point in the distal small bowel best seen on image 63 of series 2 and image 39 of series 5. There is no mass at that site. The small bowel distal to this site is not distended. There is a distended tubular structure measuring approximately 18 mm in diameter in the right side of the pelvis. This could represent a dilated Meckel's diverticulum or possibly a dilated fallopian tube. There is inflammation in the peritoneal fat around this tubular structure extending around the nondistended distal small bowel. The terminal ileum appears decompressed and normal. There is a tiny amount of free fluid in the right lower quadrant as well as a small amount of fluid in the pelvic cul-de-sac. Vascular/Lymphatic: No significant vascular findings are present. No enlarged abdominal or pelvic lymph nodes. Reproductive: The uterus appears normal. 3 cm cyst or prominent follicle on the left ovary.  The right ovary is not well seen. There is a tubular structure in the region of the right adnexa which could represent a dilated fallopian tube or a Meckel's diverticulum with adjacent inflammation. No free air or discrete abscess at this time. Other: No abdominal wall hernia. Musculoskeletal: No acute or significant osseous findings. IMPRESSION: 1. Small bowel obstruction with a transition point  in the distal small bowel. 2. Dilated tubular structure in the right side of the pelvis which could represent a dilated fallopian tube or possibly an inflamed Meckel's diverticulum with adjacent inflammation. 3. Small amount of free fluid in the right lower quadrant and in the pelvic cul-de-sac. Electronically Signed: By: Francene Boyers M.D. On: 10/16/2019 20:53   DG Abd Portable 1V-Small Bowel Protocol-Position Verification  Result Date: 10/16/2019 CLINICAL DATA:  NG tube placement EXAM: PORTABLE ABDOMEN - 1 VIEW COMPARISON:  CT abdomen pelvis 10/16/2019 FINDINGS: Transesophageal tube tip and side port are distal to the GE junction terminating within the air distended gastric body. Multiple air distended loops of small bowel are seen in the upper abdomen. Atelectatic changes present in the lung bases. Included cardiomediastinal contours are unremarkable. Excreted contrast noted within the collecting system. Osseous structures are free of acute abnormality. IMPRESSION: Satisfactory positioning of the transesophageal tube. Persistent features of small bowel obstruction. Electronically Signed   By: Kreg Shropshire M.D.   On: 10/16/2019 23:53   US PELVIC COMPLETE WITH TRANSVAGINAL  Result Date: 10/16/2019 CLINICAL DATA:  Possible dilated fallopian tube on CT EXAM: TRANSABDOMINAL AND TRANSVAGINAL ULTRASOUND OF PELVIS TECHNIQUE: Both transabdominal and transvaginal ultrasound examinations of the pelvis were performed. Transabdominal technique was performed for global imaging of the pelvis including uterus, ovaries, adnexal regions, and pelvic cul-de-sac. It was necessary to proceed with endovaginal exam following the transabdominal exam to visualize the ovaries and adnexa. COMPARISON:  CT earlier today FINDINGS: Uterus Measurements: 6.9 x 4.4 x 5.8 cm = volume: 90 mL. Small 9 mm fibroid in the inferior left uterus. Endometrium Thickness: 7 mm in thickness.  No focal abnormality visualized. Right ovary Measurements:  2.9 x 2.6 x 2.7 cm = volume: 11 mL. Dilated tubular structure in the right adnexa with internal echoes. Left ovary Measurements: 3.8 x 2.4 x 2.7 cm = volume: 11.9 mL. 2.6 cm hypoechoic complex cystic structure, likely hemorrhagic cyst. Irregular fluid-filled tubular structure noted in the left adnexa with internal echoes. Other findings Small amount of complex free fluid in the pelvis. IMPRESSION: Irregular fluid-filled tubular structures in the adnexa bilaterally with internal echoes. Question bilateral pyosalpinx. 2.6 cm complex cyst in the left ovary, likely hemorrhagic cyst. Complex free fluid in the pelvis. Electronically Signed   By: Charlett Nose M.D.   On: 10/16/2019 22:42    Assessment/Plan: 1. Bilateral pyosalpinx - questionable  - Possible pyosalpinx bilaterally based on ultrasound findings, small. Not likely the cause of patient's diffuse pain. However in light of findings, can treat with IV doxycyline until able to tolerate PO, then can treat for total of 7 days with 100 mg BID orally. Will also send GC/CT cultures.  2. Left ovarian cyst - likely hemorrhagic.  Also small, less than 3 cm in left ovary. Will likely resolve without intervention.  3. Abdominal pain  - findings of CT scan suggest that patient may have small bowel obstruction.  General Surgery following.  Currently with NG tube in place. If surgical exploration warranted, can consult GYN intraoperatively if significant abnormal findings present.    Dispo: will sign off at  this time as issue likely GI related (SBO), currently being managed by General Surgery. Patient should discharge home with a prescription for doxycycline 100 mg BID x 7 days to treat the small bilateral pyosalpinx.  Can f/u as outpatient in my clinic in 1 week. Please feel free to re-consult GYN on inpatient basis if her condition warrants.    Hildred Laser, MD [facility] 10/17/2019

## 2019-10-17 NOTE — Progress Notes (Signed)
   10/17/19 1500  Clinical Encounter Type  Visited With Patient  Visit Type Initial  Referral From Nurse  Consult/Referral To Chaplain  Upon entering the room, Chaplain finds nurse giving out meds and getting ready to clean up a spill and patient is sending up in bed. When patient found out who Chaplain was, she said she told the nurse that she was alright and didn't need to talk with anyone. Chaplain confirmed that patient was saying she was alright. Chaplain told patient that she would check on her tomorrow.

## 2019-10-18 LAB — PHOSPHORUS: Phosphorus: 3.2 mg/dL (ref 2.5–4.6)

## 2019-10-18 LAB — CBC
HCT: 36.2 % (ref 36.0–46.0)
Hemoglobin: 11.8 g/dL — ABNORMAL LOW (ref 12.0–15.0)
MCH: 28.4 pg (ref 26.0–34.0)
MCHC: 32.6 g/dL (ref 30.0–36.0)
MCV: 87 fL (ref 80.0–100.0)
Platelets: 288 10*3/uL (ref 150–400)
RBC: 4.16 MIL/uL (ref 3.87–5.11)
RDW: 14 % (ref 11.5–15.5)
WBC: 8.6 10*3/uL (ref 4.0–10.5)
nRBC: 0 % (ref 0.0–0.2)

## 2019-10-18 LAB — BASIC METABOLIC PANEL
Anion gap: 8 (ref 5–15)
BUN: 13 mg/dL (ref 6–20)
CO2: 26 mmol/L (ref 22–32)
Calcium: 8.5 mg/dL — ABNORMAL LOW (ref 8.9–10.3)
Chloride: 104 mmol/L (ref 98–111)
Creatinine, Ser: 0.97 mg/dL (ref 0.44–1.00)
GFR calc Af Amer: >60 mL/min (ref >60)
GFR calc non Af Amer: >60 mL/min (ref >60)
Glucose, Bld: 111 mg/dL — ABNORMAL HIGH (ref 70–99)
Potassium: 3.9 mmol/L (ref 3.5–5.1)
Sodium: 138 mmol/L (ref 135–145)

## 2019-10-18 LAB — MAGNESIUM: Magnesium: 2.1 mg/dL (ref 1.7–2.4)

## 2019-10-18 NOTE — Progress Notes (Signed)
Subjective:  CC: Jamie Johnston is a 39 y.o. female  Hospital stay day 2,   SBO and PID  HPI: Doing better today.  Reports passing flatus this a.m.  ROS:  General: Denies weight loss, weight gain, fatigue, fevers, chills, and night sweats. Heart: Denies chest pain, palpitations, racing heart, irregular heartbeat, leg pain or swelling, and decreased activity tolerance. Respiratory: Denies breathing difficulty, shortness of breath, wheezing, cough, and sputum. GI: Denies change in appetite, heartburn, nausea, vomiting, constipation, diarrhea, and blood in stool. GU: Denies difficulty urinating, pain with urinating, urgency, frequency, blood in urine.   Objective:   Temp:  [97.7 F (36.5 C)-98.1 F (36.7 C)] 98 F (36.7 C) (02/10 0418) Pulse Rate:  [61-85] 73 (02/10 0418) Resp:  [15-18] 18 (02/10 0418) BP: (108-132)/(64-75) 132/66 (02/10 0418) SpO2:  [97 %-99 %] 97 % (02/10 0418)     Height: 6\' 1"  (185.4 cm) Weight: (!) 136.6 kg BMI (Calculated): 39.73   Intake/Output this shift:   Intake/Output Summary (Last 24 hours) at 10/18/2019 0916 Last data filed at 10/18/2019 0724 Gross per 24 hour  Intake 1535.63 ml  Output 1425 ml  Net 110.63 ml   NG with gastric output of over a liter past 24 hours. Constitutional :  alert, cooperative, appears stated age and no distress  Respiratory:  clear to auscultation bilaterally  Cardiovascular:  regular rate and rhythm  Gastrointestinal: Soft, no guarding, significant improvement in tenderness to palpation and lower quadrants..   Skin: Cool and moist.   Psychiatric: Normal affect, non-agitated, not confused       LABS:  CMP Latest Ref Rng & Units 10/18/2019 10/17/2019 10/16/2019  Glucose 70 - 99 mg/dL 12/14/2019) 297(L) 892(J)  BUN 6 - 20 mg/dL 13 13 11   Creatinine 0.44 - 1.00 mg/dL 194(R 7.40)  Sodium 135 - 145 mmol/L 138 136 132(L)  Potassium 3.5 - 5.1 mmol/L 3.9 3.9 4.3  Chloride 98 - 111 mmol/L 104 100 96(L)  CO2 22 - 32 mmol/L 26  25 22   Calcium 8.9 - 10.3 mg/dL 8.14) 4.81(E) 9.5  Total Protein 6.5 - 8.1 g/dL - - 9.3(H)  Total Bilirubin 0.3 - 1.2 mg/dL - - 1.4(H)  Alkaline Phos 38 - 126 U/L - - 77  AST 15 - 41 U/L - - 24  ALT 0 - 44 U/L - - 18   CBC Latest Ref Rng & Units 10/18/2019 10/17/2019 10/16/2019  WBC 4.0 - 10.5 K/uL 8.6 13.1(H) 14.5(H)  Hemoglobin 12.0 - 15.0 g/dL 11.8(L) 12.5 15.0  Hematocrit 36.0 - 46.0 % 36.2 37.8 45.4  Platelets 150 - 400 K/uL 288 323 340    RADS: CLINICAL DATA:  Small bowel obstruction.  EXAM: PORTABLE ABDOMEN - 1 VIEW  COMPARISON:  October 16, 2019.  FINDINGS: Nasogastric tube tip is seen in proximal stomach. Mildly dilated small bowel loops are noted concerning for ileus or distal small bowel obstruction. No abnormal calcifications are noted.  IMPRESSION: Nasogastric tube tip seen in proximal stomach. Mildly dilated small bowel loops are noted concerning for ileus or distal small bowel obstruction.   Electronically Signed   By: 12/15/2019 M.D.   On: 10/17/2019 11:32 Assessment:   SBO and PID.  Go cytosis has now resolved with antibiotics started per OB/GYN recommendations for her PID.  She is now passing flatus as well.  Clinically looking much better despite emergent concerns of persistent dilated small bowel.  We will try clamp trial today to see if she tolerates.  Start clear liquid diet when she passes and advance diet as tolerated.

## 2019-10-18 NOTE — Progress Notes (Signed)
NG tube removed. Patient tolerating clear liquid diet. Patient passing flatus and did have a very small BM. Patient states she feels like she is near having another BM. Encouraged patient to ambulate to assist with BM. Patient denies nausea and or increased pain.   Madie Reno, RN

## 2019-10-19 LAB — BASIC METABOLIC PANEL
Anion gap: 10 (ref 5–15)
BUN: 10 mg/dL (ref 6–20)
CO2: 23 mmol/L (ref 22–32)
Calcium: 8.4 mg/dL — ABNORMAL LOW (ref 8.9–10.3)
Chloride: 102 mmol/L (ref 98–111)
Creatinine, Ser: 0.87 mg/dL (ref 0.44–1.00)
GFR calc Af Amer: >60 mL/min (ref >60)
GFR calc non Af Amer: >60 mL/min (ref >60)
Glucose, Bld: 110 mg/dL — ABNORMAL HIGH (ref 70–99)
Potassium: 3.9 mmol/L (ref 3.5–5.1)
Sodium: 135 mmol/L (ref 135–145)

## 2019-10-19 LAB — CBC
HCT: 34.9 % — ABNORMAL LOW (ref 36.0–46.0)
Hemoglobin: 11.5 g/dL — ABNORMAL LOW (ref 12.0–15.0)
MCH: 28.6 pg (ref 26.0–34.0)
MCHC: 33 g/dL (ref 30.0–36.0)
MCV: 86.8 fL (ref 80.0–100.0)
Platelets: 296 10*3/uL (ref 150–400)
RBC: 4.02 MIL/uL (ref 3.87–5.11)
RDW: 13.8 % (ref 11.5–15.5)
WBC: 6.2 10*3/uL (ref 4.0–10.5)
nRBC: 0 % (ref 0.0–0.2)

## 2019-10-19 LAB — PHOSPHORUS: Phosphorus: 4 mg/dL (ref 2.5–4.6)

## 2019-10-19 LAB — MAGNESIUM: Magnesium: 2.2 mg/dL (ref 1.7–2.4)

## 2019-10-19 MED ORDER — HYDROCODONE-ACETAMINOPHEN 5-325 MG PO TABS: 1.0000 | ORAL_TABLET | Freq: Four times a day (QID) | ORAL | 0 refills | Status: AC | PRN

## 2019-10-19 MED ORDER — DOXYCYCLINE HYCLATE 50 MG PO CAPS: 100.0000 mg | ORAL_CAPSULE | Freq: Two times a day (BID) | ORAL | 0 refills | Status: AC

## 2019-10-19 MED ORDER — DOCUSATE SODIUM 100 MG PO CAPS: 100.0000 mg | ORAL_CAPSULE | Freq: Two times a day (BID) | ORAL | 0 refills | Status: AC | PRN

## 2019-10-19 MED ORDER — IBUPROFEN 800 MG PO TABS: 800.0000 mg | ORAL_TABLET | Freq: Three times a day (TID) | ORAL | 0 refills | Status: AC | PRN

## 2019-10-19 MED ORDER — SIMETHICONE 80 MG PO CHEW: 80.0000 mg | CHEWABLE_TABLET | Freq: Four times a day (QID) | ORAL | 0 refills | Status: AC | PRN

## 2019-10-19 MED ORDER — ACETAMINOPHEN 325 MG PO TABS: 650.0000 mg | ORAL_TABLET | Freq: Three times a day (TID) | ORAL | 0 refills | Status: AC | PRN

## 2019-10-19 NOTE — Discharge Summary (Signed)
Physician Discharge Summary  Patient ID: Jamie Johnston MRN: [id number] DOB/AGE: [birth year] 39 y.o.  Admit date: 10/16/2019 Discharge date: 10/19/2019  Admission Diagnoses: Small bowel obstruction, PID  Discharge Diagnoses:  Same as above  Discharged Condition: good  Hospital Course: Admitted for above.  Initial CT findings noted a small bowel obstruction in a virgin abdomen, and upon further investigation there was possible inflammation of the fallopian tubes which led to an ultrasound study showing possible pyosalpinx.  OB/GYN was consulted at that time and they recommended a 10-day course of antibiotics and follow-up as needed.  Patient small bowel obstruction did clinically improve with return of bowel movements, and gradual toleration of diet after a short period of NG tube decompression and n.p.o. status.  At time of discharge patient still had a slightly tender abdomen, but otherwise was having regular bowel movements and tolerating regular diet.  Patient will follow up with OB/GYN for further management of her PID.  No need to follow-up with surgery service as an outpatient.  The SBO read potentially could have been a reactive ileus to the PID, although the degree of PID was noted to be very minor per OB/GYN.  Consults: gynecology  Discharge Exam: Blood pressure (!) 112/47, pulse (!) 59, temperature 98.8 F (37.1 C), temperature source Oral, resp. rate 20, height 6\' 1"  (1.854 m), weight (!) 136.6 kg, SpO2 100 %. General appearance: alert, cooperative and no distress GI: Soft, no guarding, much improved tenderness to palpation in the pelvic region  Disposition:  Discharge disposition: 01-Home or Self Care       Discharge Instructions    Discharge patient   Complete by: As directed    Discharge disposition: 01-Home or Self Care   Discharge patient date: 10/19/2019     Allergies as of 10/19/2019   No Known Allergies     Medication List    STOP taking these medications    amoxicillin-clavulanate 875-125 MG tablet Commonly known as: AUGMENTIN     TAKE these medications   acetaminophen 325 MG tablet Commonly known as: Tylenol Take 2 tablets (650 mg total) by mouth every 8 (eight) hours as needed for mild pain.   albuterol 108 (90 Base) MCG/ACT inhaler Commonly known as: VENTOLIN HFA Inhale 2 puffs into the lungs every 4 (four) hours as needed for wheezing or shortness of breath.   docusate sodium 100 MG capsule Commonly known as: Colace Take 1 capsule (100 mg total) by mouth 2 (two) times daily as needed for up to 10 days for mild constipation.   doxycycline 50 MG capsule Commonly known as: VIBRAMYCIN Take 2 capsules (100 mg total) by mouth 2 (two) times daily for 7 days.   HYDROcodone-acetaminophen 5-325 MG tablet Commonly known as: Norco Take 1 tablet by mouth every 6 (six) hours as needed for up to 6 doses for moderate pain.   ibuprofen 800 MG tablet Commonly known as: ADVIL Take 1 tablet (800 mg total) by mouth every 8 (eight) hours as needed for mild pain or moderate pain.   Promethazine-DM 6.25-15 MG/5ML Soln Take 5 mLs by mouth every 4 (four) hours as needed (cough).   simethicone 80 MG chewable tablet Commonly known as: Gas-X Chew 1 tablet (80 mg total) by mouth 4 (four) times daily as needed for up to 7 days (for abdominal pain from bloating).      Follow-up Information    11-09-1990, MD Follow up in 1 week(s).   Specialties: Obstetrics and Gynecology, Radiology Why: follow  up ovarian cyst/pyosalpinx Contact information: [place] [address] [phone]            Total time spent arranging discharge was >51min. Signed: Benjamine Sprague 10/19/2019, 2:01 PM

## 2019-10-19 NOTE — Plan of Care (Signed)
Discharge order received. Patient mental status is at baseline. Vital signs stable . No signs of acute distress. Discharge instructions given. Patient verbalized understanding. No other issues noted at this time.   

## 2019-10-19 NOTE — Discharge Instructions (Signed)
Bowel Obstruction °A bowel obstruction means that something is blocking the small or large bowel. The bowel is also called the intestine. It is the long tube that connects the stomach to the opening of the butt (anus). When something blocks the bowel, food and fluids cannot pass through like normal. This condition needs to be treated. Treatment depends on the cause of the problem and how bad the problem is. °What are the causes? °Common causes of this condition include: °· Scar tissue (adhesions) from past surgery or from high-energy X-rays (radiation). °· Recent surgery in the belly. This affects how food moves in the bowel. °· Some diseases, such as: °? Irritation of the lining of the digestive tract (Crohn's disease). °? Irritation of small pouches in the bowel (diverticulitis). °· Growths or tumors. °· A bulging organ (hernia). °· Twisting of the bowel (volvulus). °· A foreign body. °· Slipping of a part of the bowel into another part (intussusception). °What are the signs or symptoms? °Symptoms of this condition include: °· Pain in the belly. °· Feeling sick to your stomach (nauseous). °· Throwing up (vomiting). °· Bloating in the belly. °· Being unable to pass gas. °· Trouble pooping (constipation). °· Watery poop (diarrhea). °· A lot of belching. °How is this diagnosed? °This condition may be diagnosed based on: °· A physical exam. °· Medical history. °· Imaging tests, such as X-ray or CT scan. °· Blood tests. °· Urine tests. °How is this treated? °Treatment for this condition may include: °· Fluids and pain medicines that are given through an IV tube. Your doctor may tell you not to eat or drink if you feel sick to your stomach and are throwing up. °· Eating a clear liquid diet for a few days. °· Putting a small tube (nasogastric tube) into the stomach. This will help with pain, discomfort, and nausea by removing blocked air and fluids from the stomach. °· Surgery. This may be needed if other treatments do  not work. °Follow these instructions at home: °Medicines °· Take over-the-counter and prescription medicines only as told by your doctor. °· If you were prescribed an antibiotic medicine, take it as told by your doctor. Do not stop taking the antibiotic even if you start to feel better. °General instructions °· Follow your diet as told by your doctor. You may need to: °? Only drink clear liquids until you start to get better. °? Avoid solid foods. °· Return to your normal activities as told by your doctor. Ask your doctor what activities are safe for you. °· Do not sit for a long time without moving. Get up to take short walks every 1-2 hours. This is important. Ask for help if you feel weak or unsteady. °· Keep all follow-up visits as told by your doctor. This is important. °How is this prevented? °After having a bowel obstruction, you may be more likely to have another. You can do some things to stop it from happening again. °· If you have a long-term (chronic) disease, contact your doctor if you see changes or problems. °· Take steps to prevent or treat trouble pooping. Your doctor may ask that you: °? Drink enough fluid to keep your pee (urine) pale yellow. °? Take over-the-counter or prescription medicines. °? Eat foods that are high in fiber. These include beans, whole grains, and fresh fruits and vegetables. °? Limit foods that are high in fat and sugar. These include fried or sweet foods. °· Stay active. Ask your doctor which exercises are   safe for you. °· Avoid stress. °· Eat three small meals and three small snacks each day. °· Work with a food expert (dietitian) to make a meal plan that works for you. °· Do not use any products that contain nicotine or tobacco, such as cigarettes and e-cigarettes. If you need help quitting, ask your doctor. °Contact a doctor if: °· You have a fever. °· You have chills. °Get help right away if: °· You have pain or cramps that get worse. °· You throw up blood. °· You are  sick to your stomach. °· You cannot stop throwing up. °· You cannot drink fluids. °· You feel mixed up (confused). °· You feel very thirsty (dehydrated). °· Your belly gets more bloated. °· You feel weak or you pass out (faint). °Summary °· A bowel obstruction means that something is blocking the small or large bowel. °· Treatment may include IV fluids and pain medicine. You may also have a clear liquid diet, a small tube in your stomach, or surgery. °· Drink clear liquids and avoid solid foods until you get better. °This information is not intended to replace advice given to you by your health care provider. Make sure you discuss any questions you have with your health care provider. °Document Revised: 01/05/2018 Document Reviewed: 01/05/2018 °Elsevier Patient Education © 2020 [employer] ° °

## 2020-09-23 IMAGING — US US PELVIS COMPLETE WITH TRANSVAGINAL
2 series · 13 of 25 positions shown · non-contrast
Comparison: CT earlier today

CLINICAL DATA: Possible dilated fallopian tube on CT

EXAM:
TRANSABDOMINAL AND TRANSVAGINAL ULTRASOUND OF PELVIS
TECHNIQUE: Both transabdominal and transvaginal ultrasound examinations of the
pelvis were performed. Transabdominal technique was performed for
global imaging of the pelvis including uterus, ovaries, adnexal
regions, and pelvic cul-de-sac. It was necessary to proceed with
endovaginal exam following the transabdominal exam to visualize the
ovaries and adnexa.

[Series 1: us pelvis complete with transvaginal · 55 acquisitions, 12 frames shown (1 of 2)]
[im 1/55]
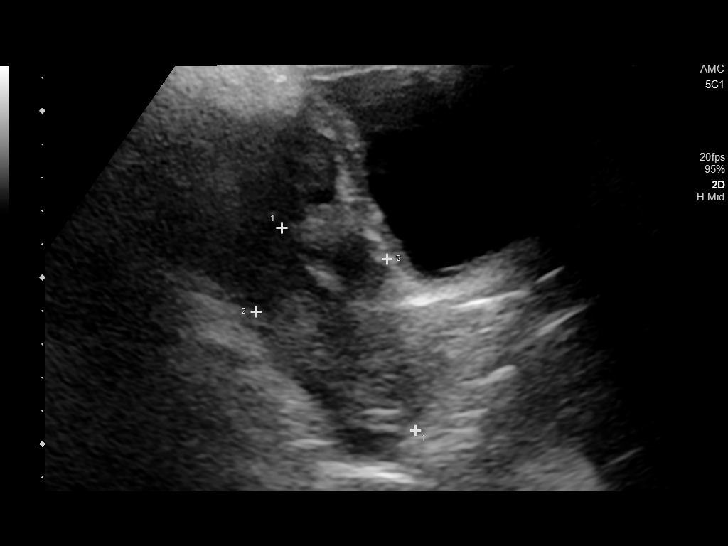
[im 5/55]
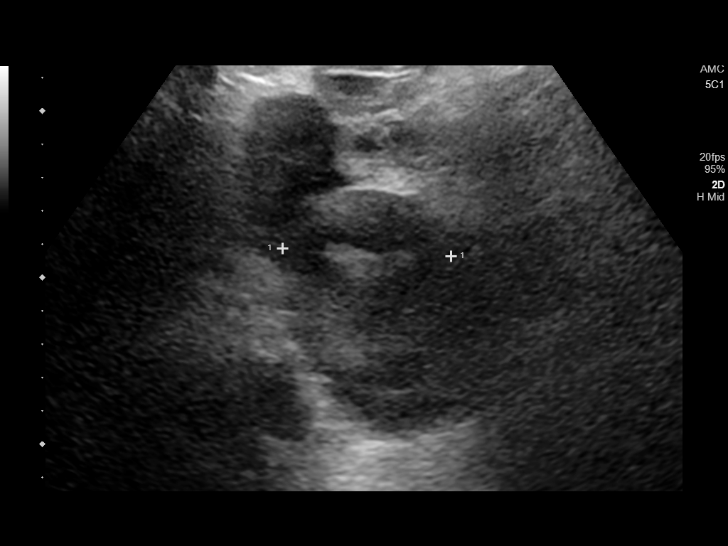
[im 10/55]
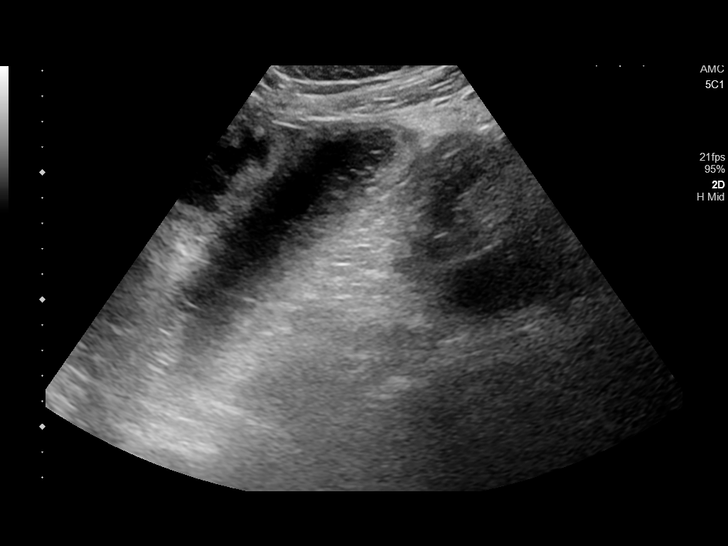
[im 15/55]
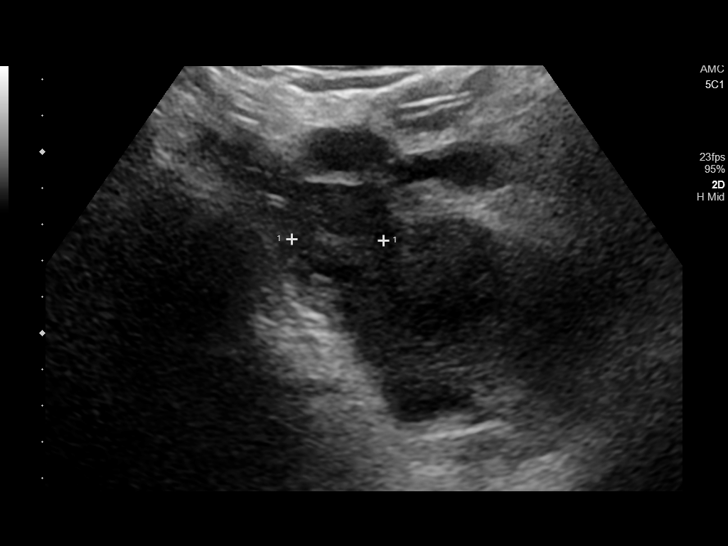
[im 19/55]
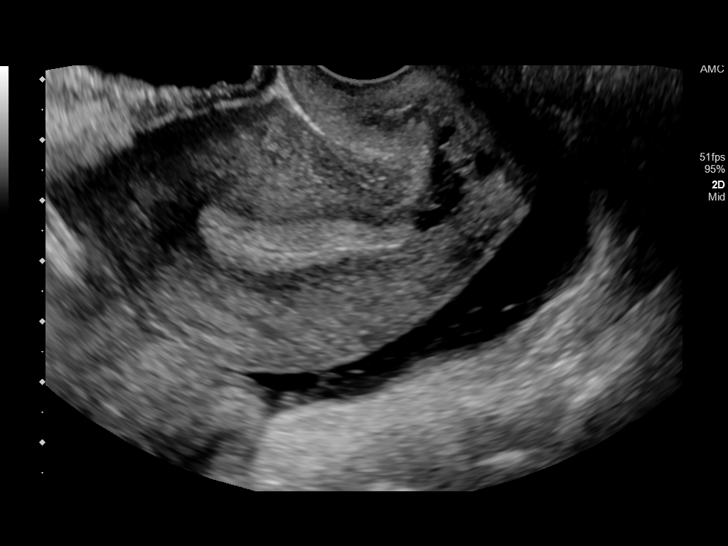
[im 24/55]
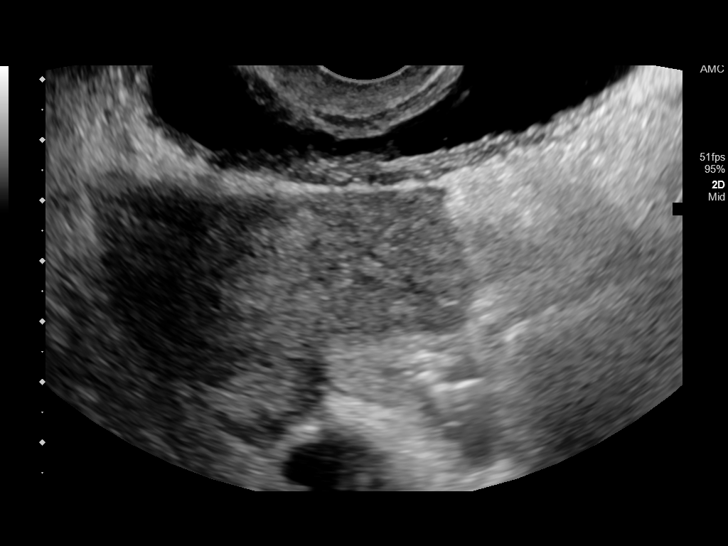
[im 29/55]
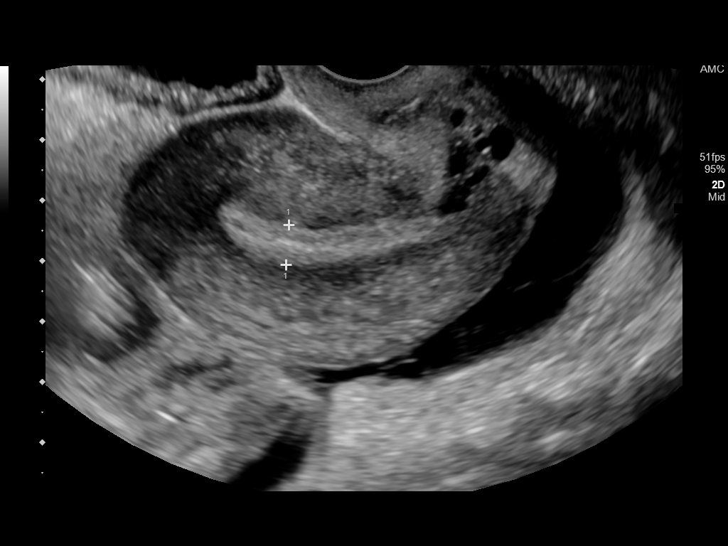
[im 33/55]
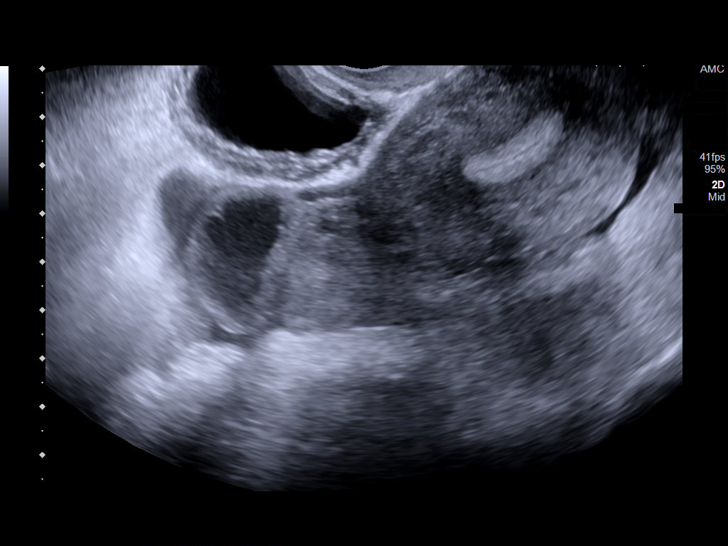
[im 38/55]
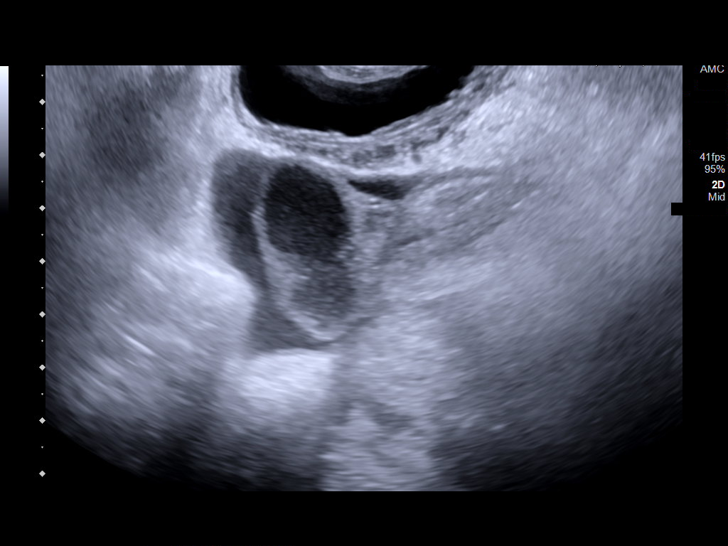
[im 43/55]
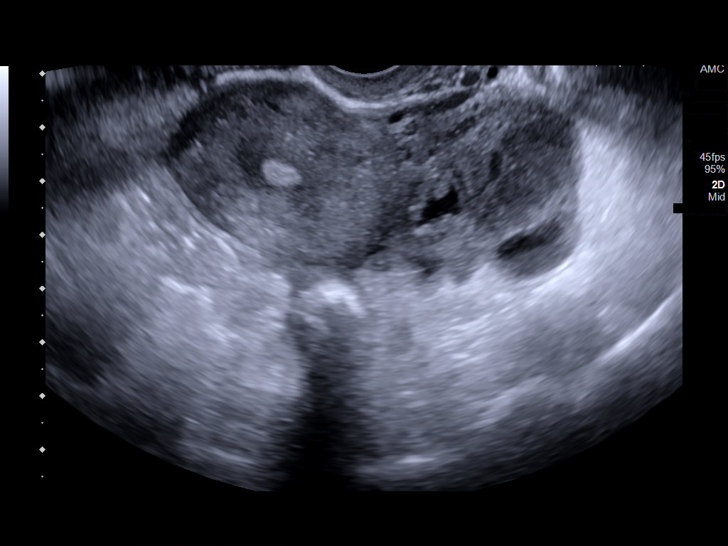
[im 47/55]
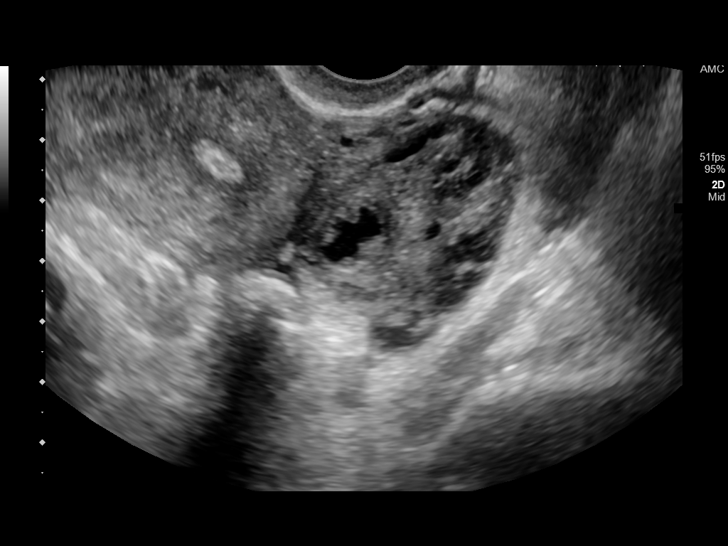
[im 52/55]
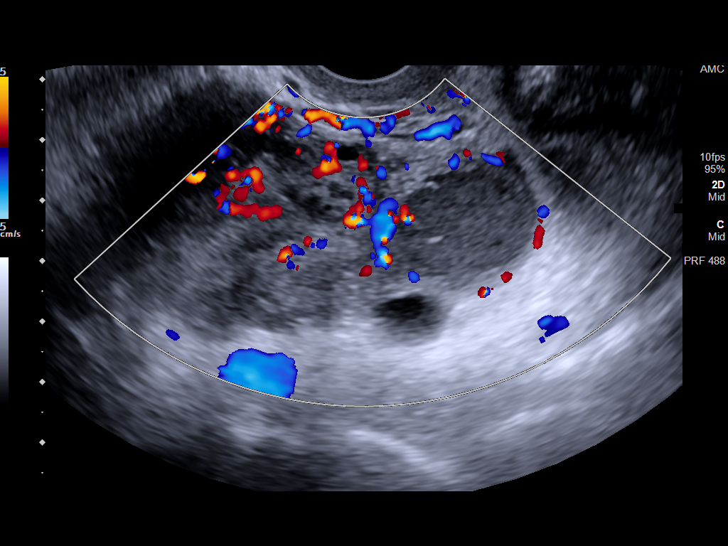

[Series 1001: us pelvis complete with transvaginal · 1 of 2 slices shown (2 of 2)]
[im 1/2]
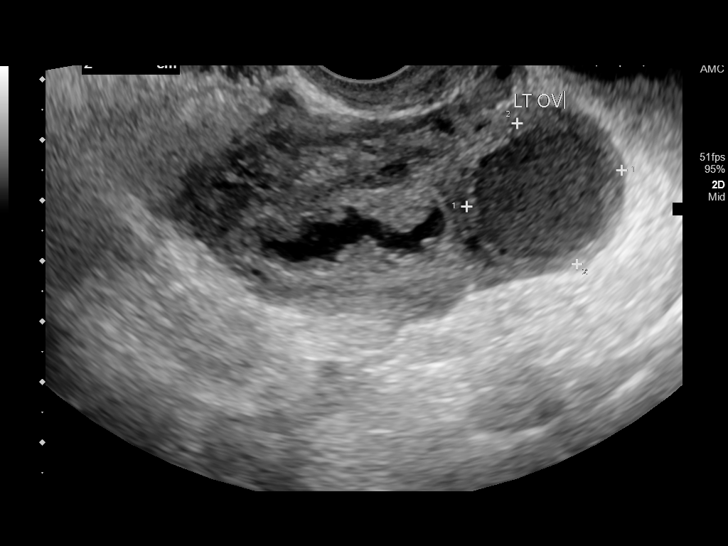

[13 of 25 positions shown; findings below may reference images not displayed]

FINDINGS: Uterus

Measurements: 6.9 x 4.4 x 5.8 cm = volume: 90 mL. Small 9 mm fibroid
in the inferior left uterus.

Endometrium

Thickness: 7 mm in thickness.  No focal abnormality visualized.

Right ovary

Measurements: 2.9 x 2.6 x 2.7 cm = volume: 11 mL. Dilated tubular
structure in the right adnexa with internal echoes.

Left ovary

Measurements: 3.8 x 2.4 x 2.7 cm = volume: 11.9 mL. 2.6 cm
hypoechoic complex cystic structure, likely hemorrhagic cyst.
Irregular fluid-filled tubular structure noted in the left adnexa
with internal echoes.

Other findings

Small amount of complex free fluid in the pelvis.
IMPRESSION: Irregular fluid-filled tubular structures in the adnexa bilaterally
with internal echoes. Question bilateral pyosalpinx.

2.6 cm complex cyst in the left ovary, likely hemorrhagic cyst.

Complex free fluid in the pelvis.

## 2020-09-23 IMAGING — CT CT ABD-PELV W/ CM
2 of 4 series · 13 of 46 positions shown, 15 images · IV contrast (omnipaque)
Comparison: None.
COMPARISON: None.

Addendum:
CLINICAL DATA: Acute abdominal pain.

EXAM:
CT ABDOMEN AND PELVIS WITH CONTRAST
TECHNIQUE: Multidetector CT imaging of the abdomen and pelvis was performed
using the standard protocol following bolus administration of
intravenous contrast.
CONTRAST:  125mL OMNIPAQUE IOHEXOL 300 MG/ML  SOLN

[Series 2: routine abd/pel with · axial · 0.98mm/px · z∈[-493,-23]mm · 10 of 114 slices shown, 12 images]
[im 10/114  soft-tissue]
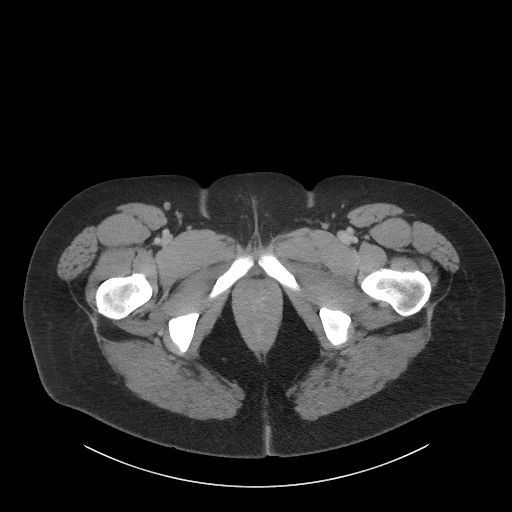
[im 10/114  bone]
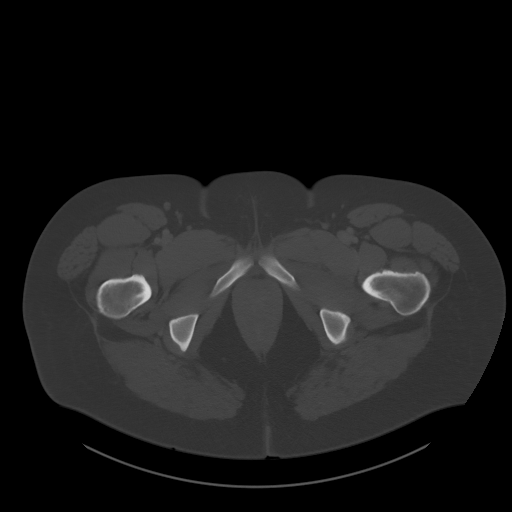
[im 19/114  soft-tissue]
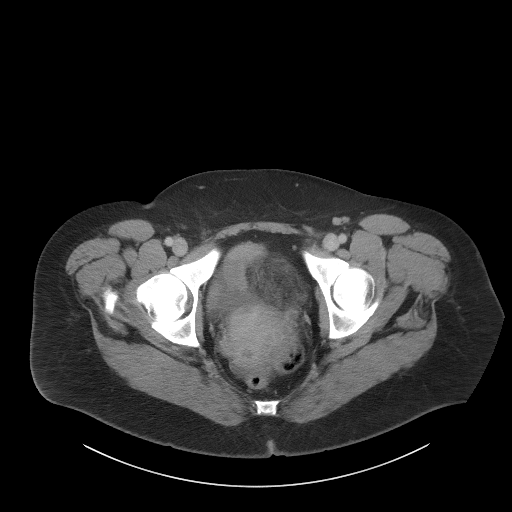
[im 32/114  soft-tissue]
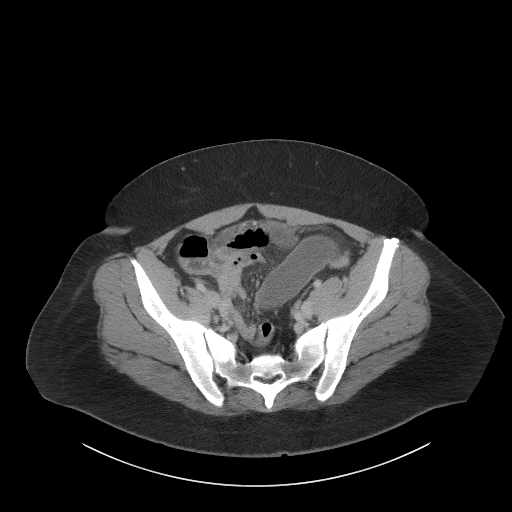
[im 41/114  soft-tissue]
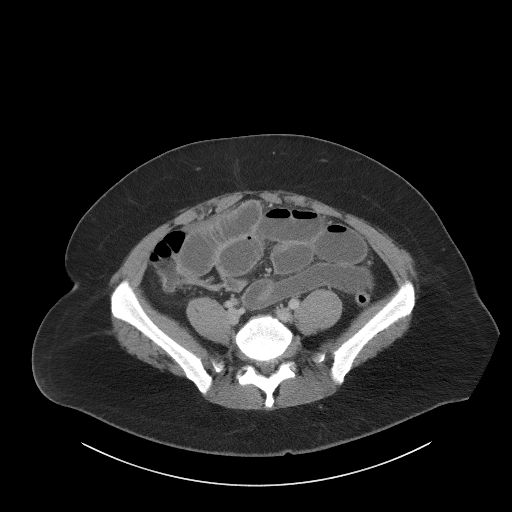
[im 50/114  soft-tissue]
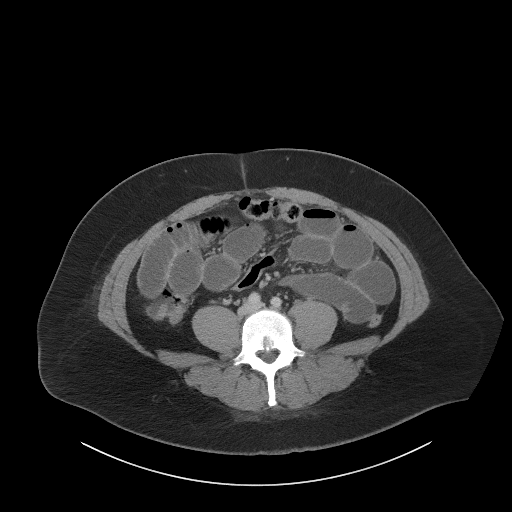
[im 64/114  soft-tissue]
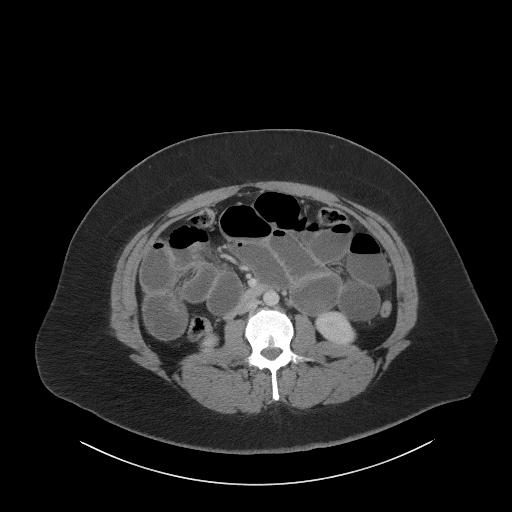
[im 73/114  soft-tissue]
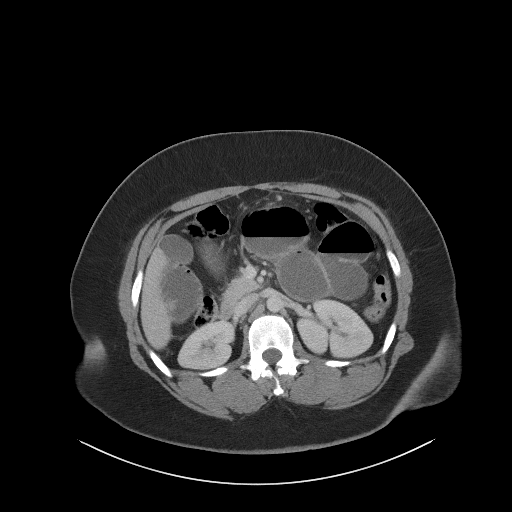
[im 86/114  soft-tissue]
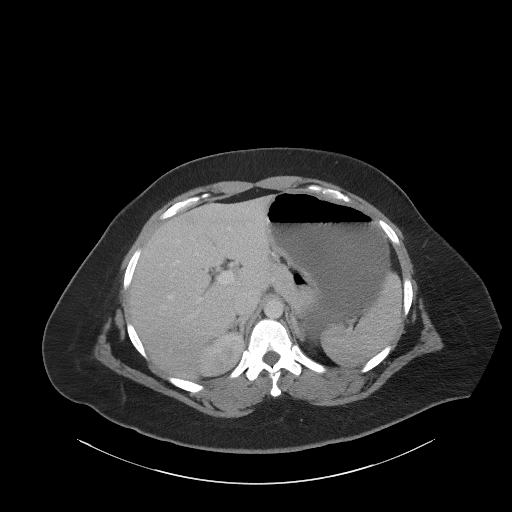
[im 95/114  soft-tissue]
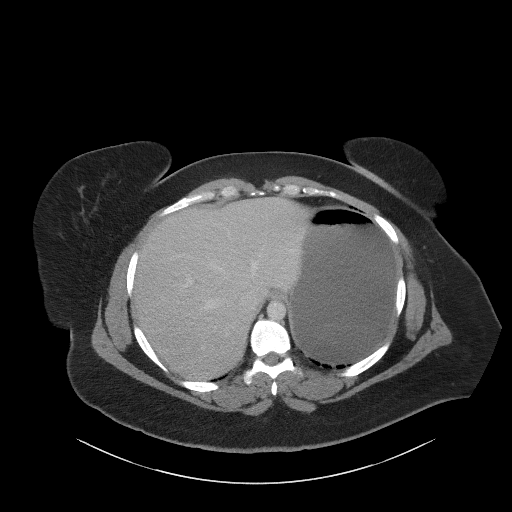
[im 95/114  bone]
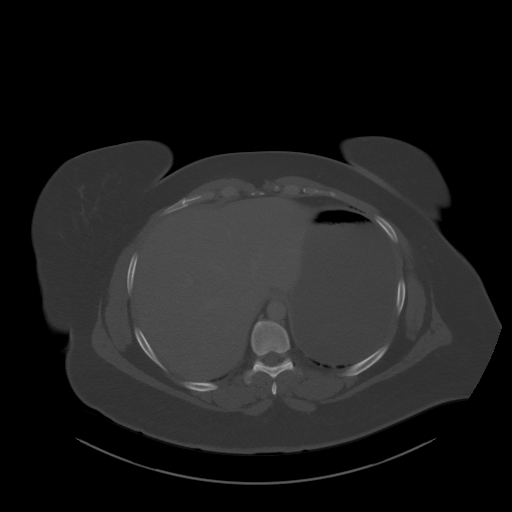
[im 104/114  soft-tissue]
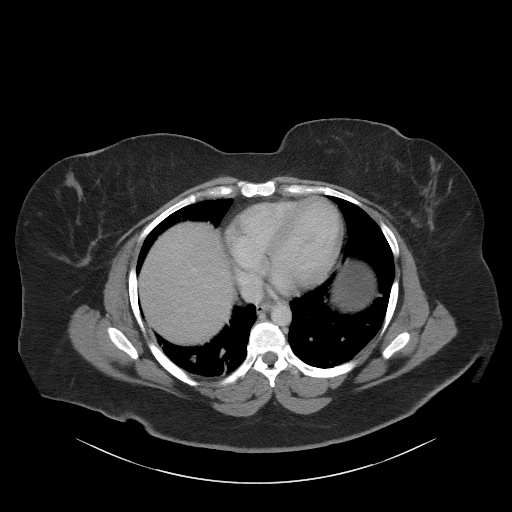

[Series 5: coronal st · coronal · 0.88mm/px · 3 of 105 slices shown]
[im 35/105  soft-tissue]
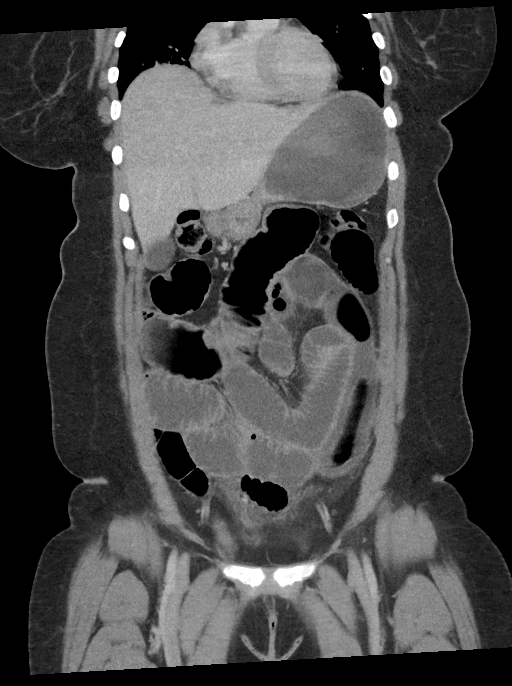
[im 47/105  soft-tissue]
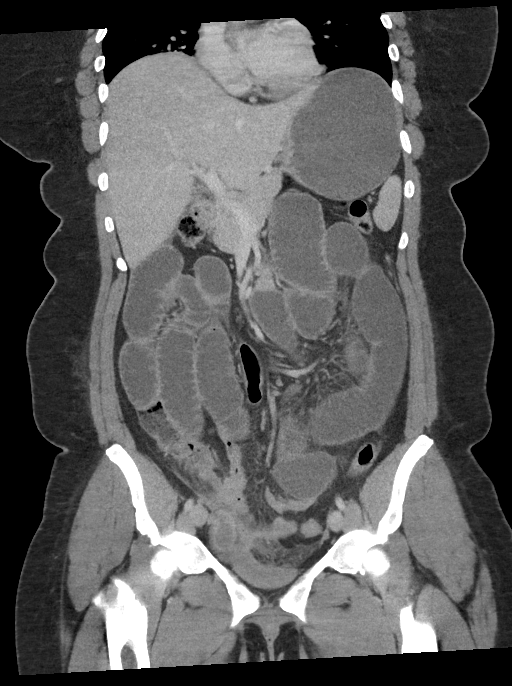
[im 58/105  soft-tissue]
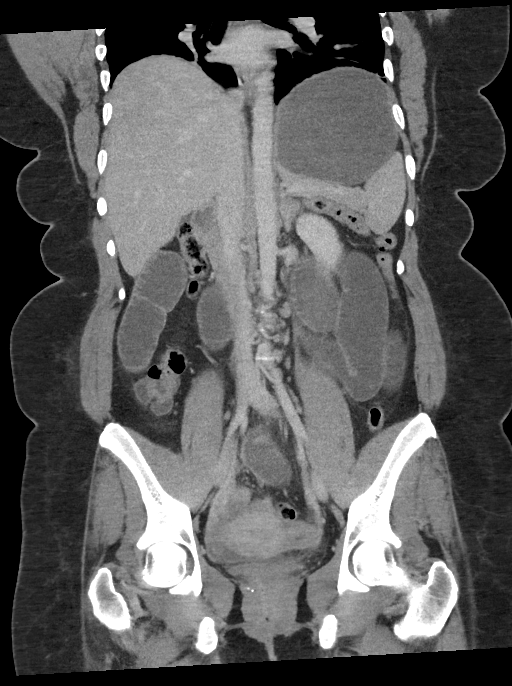

[13 of 46 positions shown; findings below may reference images not displayed]

FINDINGS: Lower chest: Minimal atelectasis at the lung bases posteriorly.
Otherwise normal.

Hepatobiliary: No focal liver abnormality is seen. No gallstones,
gallbladder wall thickening, or biliary dilatation.

Pancreas: Unremarkable. No pancreatic ductal dilatation or
surrounding inflammatory changes.

Spleen: Normal in size without focal abnormality.

Adrenals/Urinary Tract: The adrenal glands and kidneys are normal.
No hydronephrosis. The bladder appears normal. There are
inflammatory changes in the peritoneal fat around the dome of the
bladder.

Stomach/Bowel: The colon appears normal including the appendix. The
stomach and most of the small bowel are distended and there is an
abrupt transition point in the distal small bowel best seen on image
63 of series 2 and image 39 of series 5. There is no mass at that
site. The small bowel distal to this site is not distended. There is
a distended tubular structure measuring approximately 18 mm in
diameter in the right side of the pelvis. This could represent a
dilated Meckel's diverticulum or possibly a dilated fallopian tube.
There is inflammation in the peritoneal fat around this tubular
structure extending around the nondistended distal small bowel. The
terminal ileum appears decompressed and normal. There is a tiny
amount of free fluid in the right lower quadrant as well as a small
amount of fluid in the pelvic cul-de-sac.

Vascular/Lymphatic: No significant vascular findings are present. No
enlarged abdominal or pelvic lymph nodes.

Reproductive: The uterus appears normal. 3 cm cyst or prominent
follicle on the left ovary. The right ovary is not well seen. There
is a tubular structure in the region of the right adnexa which could
represent a dilated fallopian tube or a Meckel's diverticulum with
adjacent inflammation. No free air or discrete abscess at this time.

Other: No abdominal wall hernia.

Musculoskeletal: No acute or significant osseous findings.
IMPRESSION: 1. Small bowel obstruction with a transition point in the distal
small bowel.
2. Dilated tubular structure in the right side of the pelvis which
could represent a dilated fallopian tube or possibly an inflamed
Meckel's diverticulum with adjacent inflammation.
3. Small amount of free fluid in the right lower quadrant and in the
pelvic cul-de-sac.

ADDENDUM:
Critical Value/emergent results were called by telephone at the time
of interpretation on 10/16/2019 at [DATE] to the emergency room
physician, who verbally acknowledged these results.

*** End of Addendum ***
FINDINGS: Lower chest: Minimal atelectasis at the lung bases posteriorly.
Otherwise normal.

Hepatobiliary: No focal liver abnormality is seen. No gallstones,
gallbladder wall thickening, or biliary dilatation.

Pancreas: Unremarkable. No pancreatic ductal dilatation or
surrounding inflammatory changes.

Spleen: Normal in size without focal abnormality.

Adrenals/Urinary Tract: The adrenal glands and kidneys are normal.
No hydronephrosis. The bladder appears normal. There are
inflammatory changes in the peritoneal fat around the dome of the
bladder.

Stomach/Bowel: The colon appears normal including the appendix. The
stomach and most of the small bowel are distended and there is an
abrupt transition point in the distal small bowel best seen on image
63 of series 2 and image 39 of series 5. There is no mass at that
site. The small bowel distal to this site is not distended. There is
a distended tubular structure measuring approximately 18 mm in
diameter in the right side of the pelvis. This could represent a
dilated Meckel's diverticulum or possibly a dilated fallopian tube.
There is inflammation in the peritoneal fat around this tubular
structure extending around the nondistended distal small bowel. The
terminal ileum appears decompressed and normal. There is a tiny
amount of free fluid in the right lower quadrant as well as a small
amount of fluid in the pelvic cul-de-sac.

Vascular/Lymphatic: No significant vascular findings are present. No
enlarged abdominal or pelvic lymph nodes.

Reproductive: The uterus appears normal. 3 cm cyst or prominent
follicle on the left ovary. The right ovary is not well seen. There
is a tubular structure in the region of the right adnexa which could
represent a dilated fallopian tube or a Meckel's diverticulum with
adjacent inflammation. No free air or discrete abscess at this time.

Other: No abdominal wall hernia.

Musculoskeletal: No acute or significant osseous findings.
IMPRESSION: 1. Small bowel obstruction with a transition point in the distal
small bowel.
2. Dilated tubular structure in the right side of the pelvis which
could represent a dilated fallopian tube or possibly an inflamed
Meckel's diverticulum with adjacent inflammation.
3. Small amount of free fluid in the right lower quadrant and in the
pelvic cul-de-sac.

## 2023-04-13 DIAGNOSIS — R600 Localized edema: Secondary | ICD-10-CM | POA: Diagnosis not present

## 2023-04-13 DIAGNOSIS — L03116 Cellulitis of left lower limb: Secondary | ICD-10-CM | POA: Diagnosis not present

## 2023-04-14 DIAGNOSIS — D72829 Elevated white blood cell count, unspecified: Secondary | ICD-10-CM | POA: Diagnosis not present

## 2023-04-14 DIAGNOSIS — Z5902 Unsheltered homelessness: Secondary | ICD-10-CM | POA: Diagnosis not present

## 2023-04-14 DIAGNOSIS — E1165 Type 2 diabetes mellitus with hyperglycemia: Secondary | ICD-10-CM | POA: Diagnosis not present

## 2023-04-14 DIAGNOSIS — N182 Chronic kidney disease, stage 2 (mild): Secondary | ICD-10-CM | POA: Diagnosis not present

## 2023-04-14 DIAGNOSIS — R600 Localized edema: Secondary | ICD-10-CM | POA: Diagnosis not present

## 2023-04-14 DIAGNOSIS — Z6841 Body Mass Index (BMI) 40.0 and over, adult: Secondary | ICD-10-CM | POA: Diagnosis not present

## 2023-04-14 DIAGNOSIS — E11628 Type 2 diabetes mellitus with other skin complications: Secondary | ICD-10-CM | POA: Diagnosis not present

## 2023-04-14 DIAGNOSIS — E11622 Type 2 diabetes mellitus with other skin ulcer: Secondary | ICD-10-CM | POA: Diagnosis not present

## 2023-04-14 DIAGNOSIS — L03119 Cellulitis of unspecified part of limb: Secondary | ICD-10-CM | POA: Diagnosis not present

## 2023-04-14 DIAGNOSIS — R059 Cough, unspecified: Secondary | ICD-10-CM | POA: Diagnosis not present

## 2023-04-14 DIAGNOSIS — M79605 Pain in left leg: Secondary | ICD-10-CM | POA: Diagnosis not present

## 2023-04-14 DIAGNOSIS — M7989 Other specified soft tissue disorders: Secondary | ICD-10-CM | POA: Diagnosis not present

## 2023-04-14 DIAGNOSIS — R509 Fever, unspecified: Secondary | ICD-10-CM | POA: Diagnosis not present

## 2023-04-14 DIAGNOSIS — Z7984 Long term (current) use of oral hypoglycemic drugs: Secondary | ICD-10-CM | POA: Diagnosis not present

## 2023-04-14 DIAGNOSIS — A419 Sepsis, unspecified organism: Secondary | ICD-10-CM | POA: Diagnosis not present

## 2023-04-14 DIAGNOSIS — R000 Tachycardia, unspecified: Secondary | ICD-10-CM | POA: Diagnosis not present

## 2023-04-14 DIAGNOSIS — R5381 Other malaise: Secondary | ICD-10-CM | POA: Diagnosis not present

## 2023-04-14 DIAGNOSIS — L97829 Non-pressure chronic ulcer of other part of left lower leg with unspecified severity: Secondary | ICD-10-CM | POA: Diagnosis not present

## 2023-04-14 DIAGNOSIS — L03116 Cellulitis of left lower limb: Secondary | ICD-10-CM | POA: Diagnosis not present

## 2023-04-26 DIAGNOSIS — R5381 Other malaise: Secondary | ICD-10-CM | POA: Diagnosis not present

## 2023-04-26 DIAGNOSIS — N182 Chronic kidney disease, stage 2 (mild): Secondary | ICD-10-CM | POA: Diagnosis not present

## 2023-04-26 DIAGNOSIS — L03116 Cellulitis of left lower limb: Secondary | ICD-10-CM | POA: Diagnosis not present

## 2023-04-26 DIAGNOSIS — E11622 Type 2 diabetes mellitus with other skin ulcer: Secondary | ICD-10-CM | POA: Diagnosis not present

## 2023-04-26 DIAGNOSIS — M79605 Pain in left leg: Secondary | ICD-10-CM | POA: Diagnosis not present
